# Patient Record
Sex: Female | Born: 1975
Health system: Southern US, Community
[De-identification: ages and names within clinical notes are randomized; demographics above are authoritative.]

## PROBLEM LIST (undated history)

## (undated) DIAGNOSIS — R7303 Prediabetes: Secondary | ICD-10-CM

## (undated) DIAGNOSIS — N760 Acute vaginitis: Secondary | ICD-10-CM

## (undated) DIAGNOSIS — E079 Disorder of thyroid, unspecified: Secondary | ICD-10-CM

## (undated) DIAGNOSIS — E039 Hypothyroidism, unspecified: Secondary | ICD-10-CM

## (undated) DIAGNOSIS — F419 Anxiety disorder, unspecified: Secondary | ICD-10-CM

## (undated) DIAGNOSIS — F32A Depression, unspecified: Secondary | ICD-10-CM

## (undated) DIAGNOSIS — B9689 Other specified bacterial agents as the cause of diseases classified elsewhere: Secondary | ICD-10-CM

## (undated) DIAGNOSIS — K760 Fatty (change of) liver, not elsewhere classified: Secondary | ICD-10-CM

## (undated) DIAGNOSIS — G4730 Sleep apnea, unspecified: Secondary | ICD-10-CM

## (undated) HISTORY — DX: Anxiety disorder, unspecified: F41.9

## (undated) HISTORY — DX: Disorder of thyroid, unspecified: E07.9

## (undated) HISTORY — DX: Sleep apnea, unspecified: G47.30

## (undated) HISTORY — PX: ABLATION: SHX5711

## (undated) HISTORY — PX: DILATION AND CURETTAGE OF UTERUS: SHX78

## (undated) HISTORY — PX: TUBAL LIGATION: SHX77

## (undated) HISTORY — DX: Prediabetes: R73.03

## (undated) HISTORY — DX: Fatty (change of) liver, not elsewhere classified: K76.0

---

## 1999-01-26 ENCOUNTER — Other Ambulatory Visit: Admission: RE | Admit: 1999-01-26 | Discharge: 1999-01-26 | Payer: Self-pay | Admitting: *Deleted

## 1999-07-20 ENCOUNTER — Inpatient Hospital Stay (HOSPITAL_COMMUNITY): Admission: AD | Admit: 1999-07-20 | Discharge: 1999-07-20 | Payer: Self-pay | Admitting: *Deleted

## 1999-07-21 ENCOUNTER — Inpatient Hospital Stay (HOSPITAL_COMMUNITY): Admission: AD | Admit: 1999-07-21 | Discharge: 1999-07-24 | Payer: Self-pay | Admitting: *Deleted

## 1999-07-24 ENCOUNTER — Encounter (HOSPITAL_COMMUNITY): Admission: RE | Admit: 1999-07-24 | Discharge: 1999-10-22 | Payer: Self-pay | Admitting: *Deleted

## 1999-09-02 ENCOUNTER — Other Ambulatory Visit: Admission: RE | Admit: 1999-09-02 | Discharge: 1999-09-02 | Payer: Self-pay | Admitting: *Deleted

## 2000-11-10 ENCOUNTER — Other Ambulatory Visit: Admission: RE | Admit: 2000-11-10 | Discharge: 2000-11-10 | Payer: Self-pay | Admitting: *Deleted

## 2001-06-12 ENCOUNTER — Inpatient Hospital Stay (HOSPITAL_COMMUNITY): Admission: AD | Admit: 2001-06-12 | Discharge: 2001-06-14 | Payer: Self-pay | Admitting: *Deleted

## 2001-07-24 ENCOUNTER — Other Ambulatory Visit: Admission: RE | Admit: 2001-07-24 | Discharge: 2001-07-24 | Payer: Self-pay | Admitting: *Deleted

## 2003-02-14 ENCOUNTER — Other Ambulatory Visit: Admission: RE | Admit: 2003-02-14 | Discharge: 2003-02-14 | Payer: Self-pay | Admitting: Family Medicine

## 2004-11-22 ENCOUNTER — Emergency Department: Payer: Self-pay | Admitting: Emergency Medicine

## 2005-06-17 ENCOUNTER — Emergency Department: Payer: Self-pay | Admitting: Emergency Medicine

## 2006-01-31 ENCOUNTER — Ambulatory Visit: Payer: Self-pay

## 2006-08-23 ENCOUNTER — Ambulatory Visit: Payer: Self-pay | Admitting: Obstetrics & Gynecology

## 2006-09-12 ENCOUNTER — Ambulatory Visit: Payer: Self-pay | Admitting: Obstetrics & Gynecology

## 2007-01-25 ENCOUNTER — Encounter: Payer: Self-pay | Admitting: Podiatry

## 2008-09-04 ENCOUNTER — Ambulatory Visit: Payer: Self-pay | Admitting: Unknown Physician Specialty

## 2008-09-11 ENCOUNTER — Ambulatory Visit: Payer: Self-pay | Admitting: Surgery

## 2008-09-16 ENCOUNTER — Ambulatory Visit: Payer: Self-pay | Admitting: Surgery

## 2008-10-15 ENCOUNTER — Ambulatory Visit: Payer: Self-pay | Admitting: Internal Medicine

## 2008-10-22 ENCOUNTER — Ambulatory Visit: Payer: Self-pay | Admitting: Internal Medicine

## 2008-10-24 ENCOUNTER — Ambulatory Visit: Payer: Self-pay | Admitting: Internal Medicine

## 2008-10-24 DIAGNOSIS — K760 Fatty (change of) liver, not elsewhere classified: Secondary | ICD-10-CM

## 2008-10-24 HISTORY — PX: CHOLECYSTECTOMY: SHX55

## 2008-10-24 HISTORY — DX: Fatty (change of) liver, not elsewhere classified: K76.0

## 2008-10-29 ENCOUNTER — Ambulatory Visit: Payer: Self-pay

## 2008-10-30 ENCOUNTER — Ambulatory Visit: Payer: Self-pay

## 2008-11-04 ENCOUNTER — Ambulatory Visit: Payer: Self-pay | Admitting: Unknown Physician Specialty

## 2008-11-04 ENCOUNTER — Ambulatory Visit: Payer: Self-pay | Admitting: Radiology

## 2008-11-24 ENCOUNTER — Ambulatory Visit: Payer: Self-pay | Admitting: Internal Medicine

## 2010-01-14 ENCOUNTER — Ambulatory Visit: Payer: Self-pay | Admitting: Obstetrics & Gynecology

## 2010-02-24 ENCOUNTER — Ambulatory Visit: Payer: Self-pay

## 2010-06-03 ENCOUNTER — Encounter: Payer: Self-pay | Admitting: Obstetrics and Gynecology

## 2010-06-17 ENCOUNTER — Encounter: Payer: Self-pay | Admitting: Maternal & Fetal Medicine

## 2010-06-21 ENCOUNTER — Encounter: Payer: Self-pay | Admitting: Maternal & Fetal Medicine

## 2010-06-25 ENCOUNTER — Ambulatory Visit: Payer: Self-pay

## 2010-07-15 ENCOUNTER — Ambulatory Visit: Payer: Self-pay

## 2010-10-13 ENCOUNTER — Inpatient Hospital Stay: Payer: Self-pay

## 2013-10-13 ENCOUNTER — Encounter (HOSPITAL_COMMUNITY): Payer: Self-pay | Admitting: Emergency Medicine

## 2013-10-13 ENCOUNTER — Emergency Department (HOSPITAL_COMMUNITY)
Admission: EM | Admit: 2013-10-13 | Discharge: 2013-10-13 | Disposition: A | Discharge status: Home / Self Care | Payer: 59 | Source: Home / Self Care | Attending: Family Medicine | Admitting: Family Medicine

## 2013-10-13 DIAGNOSIS — J069 Acute upper respiratory infection, unspecified: Secondary | ICD-10-CM

## 2013-10-13 DIAGNOSIS — J329 Chronic sinusitis, unspecified: Secondary | ICD-10-CM

## 2013-10-13 MED ORDER — METHYLPREDNISOLONE 4 MG PO KIT: PACK | ORAL | Status: DC

## 2013-10-13 MED ORDER — FLUTICASONE PROPIONATE 50 MCG/ACT NA SUSP: 2.0000 | Freq: Two times a day (BID) | NASAL | Status: DC

## 2013-10-13 MED ORDER — DOXYCYCLINE HYCLATE 100 MG PO CAPS: 100.0000 mg | ORAL_CAPSULE | Freq: Two times a day (BID) | ORAL | Status: DC

## 2013-10-13 NOTE — ED Notes (Signed)
37 yr old female is here with complaints of sinus press;HA; ST; ear soreness - Both; chest congestion; cough- yellowish since Friday. She states she has tried Mucinex, Benadryl and Ibuprofen with no success. Denies: SOB; Chest pain

## 2013-10-13 NOTE — ED Provider Notes (Signed)
CSN: [id number]     Arrival date & time 10/13/13  1703 History   First MD Initiated Contact with Patient 10/13/13 1825     Chief Complaint  Patient presents with  . URI   (Consider location/radiation/quality/duration/timing/severity/associated sxs/prior Treatment) HPI Comments: 37 year old female presents complaining of 3 days of sinus pressure, headache, sore throat, ear soreness, cough, chest congestion. She's been taking numerous over-the-counter medications without relief of her symptoms. She also has had a subjective fever at home. No recent travel or sick contacts.  Patient is a 37 y.o. female presenting with URI.  URI Presenting symptoms: congestion, cough, fever, rhinorrhea and sore throat   Associated symptoms: no arthralgias and no myalgias     History reviewed. No pertinent past medical history. History reviewed. No pertinent past surgical history. No family history on file. History  Substance Use Topics  . Smoking status: Never Smoker   . Smokeless tobacco: Not on file  . Alcohol Use: No   OB History   Grav Para Term Preterm Abortions TAB SAB Ect Mult Living                 Review of Systems  Constitutional: Positive for fever and chills.  HENT: Positive for congestion, postnasal drip, rhinorrhea, sinus pressure and sore throat.   Eyes: Negative for visual disturbance.  Respiratory: Positive for cough. Negative for shortness of breath.   Cardiovascular: Negative for chest pain, palpitations and leg swelling.  Gastrointestinal: Negative for nausea, vomiting and abdominal pain.  Endocrine: Negative for polydipsia and polyuria.  Genitourinary: Negative for dysuria, urgency and frequency.  Musculoskeletal: Negative for arthralgias and myalgias.  Skin: Negative for rash.  Neurological: Negative for dizziness, weakness and light-headedness.    Allergies  Review of patient's allergies indicates no known allergies.  Home Medications   Current Outpatient Rx  Name   Route  Sig  Dispense  Refill  . levothyroxine (SYNTHROID, LEVOTHROID) 75 MCG tablet   Oral   Take 75 mcg by mouth daily before breakfast.         . doxycycline (VIBRAMYCIN) 100 MG capsule   Oral   Take 1 capsule (100 mg total) by mouth 2 (two) times daily.   20 capsule   0   . fluticasone (FLONASE) 50 MCG/ACT nasal spray   Each Nare   Place 2 sprays into both nostrils 2 (two) times daily.   1 g   2   . methylPREDNISolone (MEDROL DOSEPAK) 4 MG tablet      follow package directions   21 tablet   0     Dispense as written.    BP 129/104  Pulse 122  Temp(Src) 100.3 F (37.9 C) (Oral)  Resp 22  SpO2 100%  LMP 09/27/2013 Physical Exam  Nursing note and vitals reviewed. Constitutional: She is oriented to person, place, and time. Vital signs are normal. She appears well-developed and well-nourished. No distress.  HENT:  Head: Normocephalic and atraumatic.  Right Ear: External ear normal.  Left Ear: External ear normal.  Nose: Right sinus exhibits maxillary sinus tenderness and frontal sinus tenderness. Left sinus exhibits maxillary sinus tenderness and frontal sinus tenderness.  Mouth/Throat: Posterior oropharyngeal erythema present. No oropharyngeal exudate.  Neck: Normal range of motion. Neck supple.  Cardiovascular: Normal rate, regular rhythm and normal heart sounds.  Exam reveals no gallop and no friction rub.   No murmur heard. Pulmonary/Chest: Effort normal and breath sounds normal. No respiratory distress. She has no wheezes. She has no rales.  Lymphadenopathy:    She has no cervical adenopathy.  Neurological: She is alert and oriented to person, place, and time. She has normal strength. Coordination normal.  Skin: Skin is warm and dry. No rash noted. She is not diaphoretic.  Psychiatric: She has a normal mood and affect. Judgment normal.    ED Course  Procedures (including critical care time) Labs Review Labs Reviewed - No data to display Imaging  Review No results found.    MDM   1. Sinusitis   2. URI (upper respiratory infection)    The patient still has a low-grade fever and tachycardia after 3 days with ibuprofen onboard. She is having significant discomfort, will treat with antibiotics in addition to symptomatic management. Followup when necessary  Discharge Medication List as of 10/13/2013  6:39 PM    START taking these medications   Details  doxycycline (VIBRAMYCIN) 100 MG capsule Take 1 capsule (100 mg total) by mouth 2 (two) times daily., Starting 10/13/2013, Until Discontinued, Print    fluticasone (FLONASE) 50 MCG/ACT nasal spray Place 2 sprays into both nostrils 2 (two) times daily., Starting 10/13/2013, Until Discontinued, Print    methylPREDNISolone (MEDROL DOSEPAK) 4 MG tablet follow package directions, Print           Graylon Good, PA-C 10/13/13 1843

## 2013-10-14 NOTE — ED Provider Notes (Signed)
Medical screening examination/treatment/procedure(s) were performed by a resident physician or non-physician practitioner and as the supervising physician I was immediately available for consultation/collaboration.  Clementeen Graham, MD    Rodolph Bong, MD 10/14/13 [phone]

## 2013-10-24 DIAGNOSIS — R7303 Prediabetes: Secondary | ICD-10-CM

## 2013-10-24 HISTORY — DX: Prediabetes: R73.03

## 2014-08-22 ENCOUNTER — Ambulatory Visit: Payer: Self-pay | Admitting: Internal Medicine

## 2014-08-24 ENCOUNTER — Ambulatory Visit: Payer: Self-pay | Admitting: Internal Medicine

## 2014-09-23 ENCOUNTER — Ambulatory Visit: Payer: Self-pay | Admitting: Internal Medicine

## 2014-10-24 DIAGNOSIS — G4730 Sleep apnea, unspecified: Secondary | ICD-10-CM

## 2014-10-24 HISTORY — DX: Sleep apnea, unspecified: G47.30

## 2015-02-18 ENCOUNTER — Ambulatory Visit: Payer: Self-pay

## 2015-03-16 ENCOUNTER — Other Ambulatory Visit: Payer: 59

## 2015-03-16 NOTE — Patient Outreach (Signed)
[facility]) Care Management  [place]  03/16/2015   Latoya Logan 1976/04/01 [id number]  Subjective: Patient in for her regular visit- very motivated with recent decrease in A1C to 5.5%. Routinely going to the gym.  Is not checking blood sugars.  Recently separated from her husband but denies depression.   Objective:  Filed Vitals:   03/16/15 1642  BP: 108/80  Weight 141.5lbs   Current Medications:  Current Outpatient Prescriptions  Medication Sig Dispense Refill  . Cetirizine HCl (ZYRTEC ALLERGY PO) Take 1 tablet by mouth.    . fexofenadine (ALLEGRA) 30 MG tablet Take 30 mg by mouth 2 (two) times daily.    . fluticasone (FLONASE) 50 MCG/ACT nasal spray Place 2 sprays into both nostrils 2 (two) times daily. 1 g 2  . levonorgestrel (MIRENA) 20 MCG/24HR IUD 1 each by Intrauterine route once.    . doxycycline (VIBRAMYCIN) 100 MG capsule Take 1 capsule (100 mg total) by mouth 2 (two) times daily. (Patient not taking: Reported on 03/16/2015) 20 capsule 0  . levothyroxine (SYNTHROID, LEVOTHROID) 75 MCG tablet Take 75 mcg by mouth daily before breakfast.    . methylPREDNISolone (MEDROL DOSEPAK) 4 MG tablet follow package directions (Patient not taking: Reported on 03/16/2015) 21 tablet 0   No current facility-administered medications for this visit.    Functional Status:  In your present state of health, do you have any difficulty performing the following activities: 03/16/2015  Hearing? N  Vision? N  Difficulty concentrating or making decisions? N  Walking or climbing stairs? N  Dressing or bathing? N  Doing errands, shopping? N    Fall/Depression Screening: PHQ 2/9 Scores 03/16/2015  PHQ - 2 Score 0    THN CM Care Plan Problem One        Patient Outreach from 03/16/2015 in [place] Problem One  Potential for elevated blood sugars   Care Plan for Problem One  Active   THN Long Term Goal (31-90 days)  Maintain A1C less  than 6.4%   [facility]N Long Term Goal Start Date  03/16/15   Interventions for Problem One Long Term Goal  -Continue exercising most days of the week for at least 30 minutes  - check sugar twice a week,  1 fasting and 1 2 hours after a meal    - if you want to lose the extra few pounds, you may want to try the Lose It app      [name], RN, [place], [place], [place]:  [phone]  Fax:  [phone] E-mail: [email or web address] [address] [place], [place]  [address]

## 2015-03-23 ENCOUNTER — Encounter: Payer: Self-pay | Admitting: Emergency Medicine

## 2015-03-23 ENCOUNTER — Emergency Department
Admission: EM | Admit: 2015-03-23 | Discharge: 2015-03-23 | Disposition: A | Discharge status: Home / Self Care | Payer: 59 | Attending: Emergency Medicine | Admitting: Emergency Medicine

## 2015-03-23 ENCOUNTER — Emergency Department: Payer: 59

## 2015-03-23 DIAGNOSIS — Y998 Other external cause status: Secondary | ICD-10-CM | POA: Insufficient documentation

## 2015-03-23 DIAGNOSIS — Z79899 Other long term (current) drug therapy: Secondary | ICD-10-CM | POA: Insufficient documentation

## 2015-03-23 DIAGNOSIS — S0993XA Unspecified injury of face, initial encounter: Secondary | ICD-10-CM | POA: Diagnosis present

## 2015-03-23 DIAGNOSIS — T148XXA Other injury of unspecified body region, initial encounter: Secondary | ICD-10-CM

## 2015-03-23 DIAGNOSIS — S0083XA Contusion of other part of head, initial encounter: Secondary | ICD-10-CM | POA: Insufficient documentation

## 2015-03-23 DIAGNOSIS — Y9389 Activity, other specified: Secondary | ICD-10-CM | POA: Insufficient documentation

## 2015-03-23 DIAGNOSIS — Y9289 Other specified places as the place of occurrence of the external cause: Secondary | ICD-10-CM | POA: Diagnosis not present
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
## 2015-03-23 NOTE — ED Notes (Signed)
Pt reports that she has safe place to go. Pt walked out of hospital and into parking lot. Pt in car safely.

## 2015-03-23 NOTE — ED Notes (Signed)
Pt alert and oriented X4, active, cooperative, pt in NAD. RR even and unlabored, color WNL.  Pt informed to return if any life threatening symptoms occur.   

## 2015-03-23 NOTE — ED Notes (Signed)
Pt back in room.
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
## 2015-03-23 NOTE — ED Notes (Signed)
[facility] at bedside.

## 2015-03-23 NOTE — Discharge Instructions (Signed)
Please seek medical attention for any high fevers, chest pain, shortness of breath, change in behavior, persistent vomiting, bloody stool or any other new or concerning symptoms.  Contusion A contusion is a deep bruise. Contusions are the result of an injury that caused bleeding under the skin. The contusion may turn blue, purple, or yellow. Minor injuries will give you a painless contusion, but more severe contusions may stay painful and swollen for a few weeks.  CAUSES  A contusion is usually caused by a blow, trauma, or direct force to an area of the body. SYMPTOMS   Swelling and redness of the injured area.  Bruising of the injured area.  Tenderness and soreness of the injured area.  Pain. DIAGNOSIS  The diagnosis can be made by taking a history and physical exam. An X-ray, CT scan, or MRI may be needed to determine if there were any associated injuries, such as fractures. TREATMENT  Specific treatment will depend on what area of the body was injured. In general, the best treatment for a contusion is resting, icing, elevating, and applying cold compresses to the injured area. Over-the-counter medicines may also be recommended for pain control. Ask your caregiver what the best treatment is for your contusion. HOME CARE INSTRUCTIONS   Put ice on the injured area.  Put ice in a plastic bag.  Place a towel between your skin and the bag.  Leave the ice on for 15-20 minutes, 3-4 times a day, or as directed by your health care provider.  Only take over-the-counter or prescription medicines for pain, discomfort, or fever as directed by your caregiver. Your caregiver may recommend avoiding anti-inflammatory medicines (aspirin, ibuprofen, and naproxen) for 48 hours because these medicines may increase bruising.  Rest the injured area.  If possible, elevate the injured area to reduce swelling. SEEK IMMEDIATE MEDICAL CARE IF:   You have increased bruising or swelling.  You have pain  that is getting worse.  Your swelling or pain is not relieved with medicines. MAKE SURE YOU:   Understand these instructions.  Will watch your condition.  Will get help right away if you are not doing well or get worse. Document Released: 07/20/2005 Document Revised: 10/15/2013 Document Reviewed: 08/15/2011 [facility] Patient Information 2015 [place], [place]. This information is not intended to replace advice given to you by your health care provider. Make sure you discuss any questions you have with your health care provider.
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
## 2015-03-23 NOTE — ED Notes (Signed)
Pt to CT

## 2015-04-30 ENCOUNTER — Emergency Department (HOSPITAL_COMMUNITY)
Admission: EM | Admit: 2015-04-30 | Discharge: 2015-04-30 | Disposition: A | Discharge status: Home / Self Care | Payer: 59 | Source: Home / Self Care | Attending: Family Medicine | Admitting: Family Medicine

## 2015-04-30 ENCOUNTER — Encounter (HOSPITAL_COMMUNITY): Payer: Self-pay | Admitting: Emergency Medicine

## 2015-04-30 ENCOUNTER — Other Ambulatory Visit (HOSPITAL_COMMUNITY)
Admission: RE | Admit: 2015-04-30 | Discharge: 2015-04-30 | Disposition: A | Discharge status: Home / Self Care | Payer: 59 | Source: Ambulatory Visit | Attending: Family Medicine | Admitting: Family Medicine

## 2015-04-30 DIAGNOSIS — Z711 Person with feared health complaint in whom no diagnosis is made: Secondary | ICD-10-CM

## 2015-04-30 DIAGNOSIS — N760 Acute vaginitis: Secondary | ICD-10-CM | POA: Insufficient documentation

## 2015-04-30 DIAGNOSIS — Z113 Encounter for screening for infections with a predominantly sexual mode of transmission: Secondary | ICD-10-CM | POA: Insufficient documentation

## 2015-04-30 LAB — POCT URINALYSIS DIP (DEVICE)
Bilirubin Urine: NEGATIVE
Glucose, UA: NEGATIVE mg/dL
Hgb urine dipstick: NEGATIVE
Ketones, ur: NEGATIVE mg/dL
Leukocytes, UA: NEGATIVE
Nitrite: NEGATIVE
PH: 7 (ref 5.0–8.0)
PROTEIN: NEGATIVE mg/dL
Specific Gravity, Urine: 1.015 (ref 1.005–1.030)
Urobilinogen, UA: 0.2 mg/dL (ref 0.0–1.0)

## 2015-04-30 LAB — POCT PREGNANCY, URINE: Preg Test, Ur: NEGATIVE

## 2015-04-30 MED ORDER — FLUTICASONE PROPIONATE 0.005 % EX OINT: 1.0000 "application " | TOPICAL_OINTMENT | Freq: Two times a day (BID) | CUTANEOUS | Status: DC

## 2015-04-30 NOTE — ED Provider Notes (Signed)
CSN: [id number]     Arrival date & time 04/30/15  1719 History   First MD Initiated Contact with Patient 04/30/15 1907     Chief Complaint  Patient presents with  . Vaginal Itching   (Consider location/radiation/quality/duration/timing/severity/associated sxs/prior Treatment) Patient is a 39 y.o. female presenting with vaginal itching. The history is provided by the patient.  Vaginal Itching This is a new problem. The current episode started more than 1 week ago (pubic area irritation from waxing procedure.). The problem has been gradually worsening. Pertinent negatives include no chest pain and no abdominal pain.    Past Medical History  Diagnosis Date  . Thyroid disease   . Pre-diabetes 2015  . Fatty liver 2010  . Sleep apnea 2016   Past Surgical History  Procedure Laterality Date  . Cholecystectomy  2010   Family History  Problem Relation Age of Onset  . Diabetes Mother   . Hypertension Mother   . Diabetes Father   . Heart disease Father   . Alcohol abuse Father    History  Substance Use Topics  . Smoking status: Never Smoker   . Smokeless tobacco: Never Used  . Alcohol Use: No   OB History    No data available     Review of Systems  Constitutional: Negative.   Cardiovascular: Negative for chest pain.  Gastrointestinal: Negative for abdominal pain.  Genitourinary: Negative for vaginal discharge, vaginal pain and pelvic pain.  Skin: Positive for rash.    Allergies  Percocet  Home Medications   Prior to Admission medications   Medication Sig Start Date End Date Taking? Authorizing Provider  Cetirizine HCl (ZYRTEC ALLERGY PO) Take 1 tablet by mouth.    Historical Provider, MD  doxycycline (VIBRAMYCIN) 100 MG capsule Take 1 capsule (100 mg total) by mouth 2 (two) times daily. Patient not taking: Reported on 03/16/2015 10/13/13   Liam Graham, PA-C  fexofenadine (ALLEGRA) 30 MG tablet Take 30 mg by mouth 2 (two) times daily.    Historical Provider, MD   fluticasone (CUTIVATE) 0.005 % ointment Apply 1 application topically 2 (two) times daily. 04/30/15   Billy Fischer, MD  fluticasone (FLONASE) 50 MCG/ACT nasal spray Place 2 sprays into both nostrils 2 (two) times daily. 10/13/13   Liam Graham, PA-C  levonorgestrel (MIRENA) 20 MCG/24HR IUD 1 each by Intrauterine route once.    Historical Provider, MD  levothyroxine (SYNTHROID, LEVOTHROID) 75 MCG tablet Take 75 mcg by mouth daily before breakfast.    Historical Provider, MD  methylPREDNISolone (MEDROL DOSEPAK) 4 MG tablet follow package directions Patient not taking: Reported on 03/16/2015 10/13/13   Liam Graham, PA-C   BP 131/69 mmHg  Pulse 66  Temp(Src) 98.1 F (36.7 C) (Oral)  Resp 16  SpO2 100%  LMP 04/23/2015 Physical Exam  Constitutional: She is oriented to person, place, and time. She appears well-developed and well-nourished.  Abdominal: Soft. Bowel sounds are normal. There is no tenderness.  Genitourinary: Vagina normal and uterus normal. There is no rash, tenderness or lesion on the right labia. There is no rash, tenderness or lesion on the left labia. Cervix exhibits no motion tenderness, no discharge and no friability. Right adnexum displays no tenderness and no fullness. Left adnexum displays no tenderness and no fullness. No erythema or tenderness in the vagina. No vaginal discharge found.  Neurological: She is alert and oriented to person, place, and time.  Skin: Skin is warm and dry.  Nursing note and vitals reviewed.  ED Course  Procedures (including critical care time) Labs Review Labs Reviewed  HIV ANTIBODY (ROUTINE TESTING)  RPR  POCT URINALYSIS DIP (DEVICE)  POCT PREGNANCY, URINE  CERVICOVAGINAL ANCILLARY ONLY    Imaging Review No results found.   MDM   1. Concern about STD in female without diagnosis       Billy Fischer, MD 04/30/15 [phone]

## 2015-04-30 NOTE — Discharge Instructions (Signed)
Use medicine as prescribed, we will call if tests show a need for other treatment.  

## 2015-04-30 NOTE — ED Notes (Signed)
C/o vaginal itching for a week now States she used jock itch spray as tx States area is irriatiated  Has used unprotective sex  Wants to be check for std

## 2015-05-01 LAB — CERVICOVAGINAL ANCILLARY ONLY
CHLAMYDIA, DNA PROBE: NEGATIVE
NEISSERIA GONORRHEA: NEGATIVE
WET PREP (BD AFFIRM): NEGATIVE

## 2015-05-01 LAB — HIV ANTIBODY (ROUTINE TESTING W REFLEX): HIV Screen 4th Generation wRfx: NONREACTIVE

## 2015-05-01 LAB — RPR: RPR Ser Ql: NONREACTIVE

## 2015-05-01 NOTE — ED Notes (Signed)
Final reports negative for GC, chlamydia,

## 2015-05-01 NOTE — ED Notes (Signed)
Final lab report negative for HIV, RPR, candida, gardnerella, trichomonas

## 2015-06-15 ENCOUNTER — Ambulatory Visit: Payer: 59

## 2015-06-15 ENCOUNTER — Other Ambulatory Visit: Payer: Self-pay

## 2015-06-15 NOTE — Patient Outreach (Signed)
[facility]) Care Management  06/15/2015  Latoya Logan [birth year] [id number]   Sent an e-mail requesting the patient contact me to reschedule today's missed appointment.    [name], RN, BA, [place], [place] Direct Dial:  [phone]  Fax:  [phone] E-mail: [email or web address] [address], [place], [place]  [address]

## 2015-06-15 NOTE — Patient Outreach (Signed)
[facility]) Care Management  06/15/2015  Latoya Logan 1976-02-17 [id number]   Patient did not show for scheduled appointment.   [name], RN, BA, [place], [place] Direct Dial:  [phone]  Fax:  [phone] E-mail: [email or web address] [address], [place], [place]  [address]

## 2015-09-29 ENCOUNTER — Other Ambulatory Visit (HOSPITAL_COMMUNITY)
Admission: RE | Admit: 2015-09-29 | Discharge: 2015-09-29 | Disposition: A | Discharge status: Home / Self Care | Payer: 59 | Source: Ambulatory Visit | Attending: Family Medicine | Admitting: Family Medicine

## 2015-09-29 ENCOUNTER — Emergency Department (HOSPITAL_COMMUNITY)
Admission: EM | Admit: 2015-09-29 | Discharge: 2015-09-29 | Disposition: A | Discharge status: Home / Self Care | Payer: 59 | Source: Home / Self Care

## 2015-09-29 ENCOUNTER — Encounter (HOSPITAL_COMMUNITY): Payer: Self-pay | Admitting: Emergency Medicine

## 2015-09-29 DIAGNOSIS — Z113 Encounter for screening for infections with a predominantly sexual mode of transmission: Secondary | ICD-10-CM | POA: Diagnosis not present

## 2015-09-29 DIAGNOSIS — N939 Abnormal uterine and vaginal bleeding, unspecified: Secondary | ICD-10-CM

## 2015-09-29 DIAGNOSIS — N760 Acute vaginitis: Secondary | ICD-10-CM | POA: Diagnosis present

## 2015-09-29 LAB — POCT PREGNANCY, URINE: Preg Test, Ur: NEGATIVE

## 2015-09-29 NOTE — ED Notes (Signed)
The patient presented to the [facility] with a complaint of abnormal vaginal bleeding that has been going on a week to 8 days.

## 2015-09-29 NOTE — Discharge Instructions (Signed)
Abnormal Uterine Bleeding °Abnormal uterine bleeding means bleeding from the vagina that is not your normal menstrual period. This can be: °· Bleeding or spotting between periods. °· Bleeding after sex (sexual intercourse). °· Bleeding that is heavier or more than normal. °· Periods that last longer than usual. °· Bleeding after menopause. °There are many problems that may cause this. Treatment will depend on the cause of the bleeding. Any kind of bleeding that is not normal should be reviewed by your doctor.  °HOME CARE °Watch your condition for any changes. These actions may lessen any discomfort you are having: °· Do not use tampons or douches as told by your doctor. °· Change your pads often. °You should get regular pelvic exams and Pap tests. Keep all appointments for tests as told by your doctor. °GET HELP IF: °· You are bleeding for more than 1 week. °· You feel dizzy at times. °GET HELP RIGHT AWAY IF:  °· You pass out. °· You have to change pads every 15 to 30 minutes. °· You have belly pain. °· You have a fever. °· You become sweaty or weak. °· You are passing large blood clots from the vagina. °· You feel sick to your stomach (nauseous) and throw up (vomit). °MAKE SURE YOU: °· Understand these instructions. °· Will watch your condition. °· Will get help right away if you are not doing well or get worse. °  °This information is not intended to replace advice given to you by your health care provider. Make sure you discuss any questions you have with your health care provider. °  °Document Released: 08/07/2009 Document Revised: 10/15/2013 Document Reviewed: 05/09/2013 °Elsevier Interactive Patient Education ©2016 [employer] Inc[employer] ° °

## 2015-09-29 NOTE — ED Provider Notes (Signed)
CSN: [id number]     Arrival date & time 09/29/15  1757 History   None    Chief Complaint  Patient presents with  . Vaginal Bleeding   (Consider location/radiation/quality/duration/timing/severity/associated sxs/prior Treatment) HPI History obtained from patient:   LOCATION: Vagina SEVERITY: No pain DURATION: 2 weeks CONTEXT: Continued spotting after menses, patient states that she is sexually active but has one partner at this time. She has no concerns that she has an STD at this time. She is a bit concerned about the possibility of being pregnant, her form of birth control is the IUD which she was told was in good position on the last vaginal exam.   MODIFYING FACTORS: None ASSOCIATED SYMPTOMS: None TIMING: Episodic OCCUPATION: Development worker, community  Past Medical History  Diagnosis Date  . Thyroid disease   . Pre-diabetes 2015  . Fatty liver 2010  . Sleep apnea 2016   Past Surgical History  Procedure Laterality Date  . Cholecystectomy  2010   Family History  Problem Relation Age of Onset  . Diabetes Mother   . Hypertension Mother   . Diabetes Father   . Heart disease Father   . Alcohol abuse Father    Social History  Substance Use Topics  . Smoking status: Never Smoker   . Smokeless tobacco: Never Used  . Alcohol Use: No   OB History    No data available     Review of Systems  Constitutional: Negative.   HENT: Negative.   Respiratory: Negative.   Cardiovascular: Negative.   Genitourinary: Positive for vaginal bleeding.    Allergies  Percocet  Home Medications   Prior to Admission medications   Medication Sig Start Date End Date Taking? Authorizing Provider  Cetirizine HCl (ZYRTEC ALLERGY PO) Take 1 tablet by mouth.    Historical Provider, MD  doxycycline (VIBRAMYCIN) 100 MG capsule Take 1 capsule (100 mg total) by mouth 2 (two) times daily. Patient not taking: Reported on 03/16/2015 10/13/13   Liam Graham, PA-C  fexofenadine (ALLEGRA) 30 MG tablet  Take 30 mg by mouth 2 (two) times daily.    Historical Provider, MD  fluticasone (CUTIVATE) 0.005 % ointment Apply 1 application topically 2 (two) times daily. 04/30/15   Billy Fischer, MD  fluticasone (FLONASE) 50 MCG/ACT nasal spray Place 2 sprays into both nostrils 2 (two) times daily. 10/13/13   Liam Graham, PA-C  levonorgestrel (MIRENA) 20 MCG/24HR IUD 1 each by Intrauterine route once.    Historical Provider, MD  levothyroxine (SYNTHROID, LEVOTHROID) 75 MCG tablet Take 75 mcg by mouth daily before breakfast.    Historical Provider, MD  methylPREDNISolone (MEDROL DOSEPAK) 4 MG tablet follow package directions Patient not taking: Reported on 03/16/2015 10/13/13   Liam Graham, PA-C   Meds Ordered and Administered this Visit  Medications - No data to display  BP 125/76 mmHg  Pulse 64  Temp(Src) 98.6 F (37 C) (Oral)  Resp 16  SpO2 100%  LMP 09/17/2015 (Exact Date) No data found.   Physical Exam  Constitutional: She appears well-developed and well-nourished.  HENT:  Head: Normocephalic and atraumatic.  Pulmonary/Chest: Effort normal.  Genitourinary:    Vaginal examination was done with patient's permission and the female chaperone present.  Nursing note and vitals reviewed.   ED Course  Procedures (including critical care time)  Labs Review Labs Reviewed  POCT PREGNANCY, URINE  CERVICOVAGINAL ANCILLARY ONLY    Imaging Review No results found.   Visual Acuity Review  Right Eye Distance:  Left Eye Distance:   Bilateral Distance:    Right Eye Near:   Left Eye Near:    Bilateral Near:         MDM   1. Abnormal uterine bleeding (AUB)    as discussed with patient prior to examination I advised her that I may not have an exact answer for her abnormal uterine bleeding tonight can reassure her that there is no life-threatening hemorrhage. I have also advised her that she has an appointment with her gynecologist next week she should keep that appointment  and have her abnormal uterine bleeding evaluated by the specialist at that time. She is advised that if bleeding worsens to the point that she is soaking pads she is attending the emergency department. Instructions of care provided discharged home in stable condition.    Konrad Felix, PA 09/29/15 2004

## 2015-09-30 LAB — CERVICOVAGINAL ANCILLARY ONLY
Chlamydia: NEGATIVE
Neisseria Gonorrhea: NEGATIVE

## 2015-10-01 LAB — CERVICOVAGINAL ANCILLARY ONLY: Wet Prep (BD Affirm): NEGATIVE

## 2015-10-01 NOTE — ED Notes (Addendum)
Final report of STD testing ane wet prep tests are negative

## 2015-10-28 ENCOUNTER — Other Ambulatory Visit: Payer: Self-pay

## 2015-10-28 NOTE — Patient Outreach (Signed)
[facility]) Care Management  10/28/2015  Skyanna Blehm March 27, 1976 [id number]  I sent a meeting request on 10/20/15 @ 1:09pm to Maevyn to attend a meeting with me this morning, after I ran into  Lorain.  (She told me she was going to be in [place] the first week of January and to schedule an appointment.)  I had set a meeting for this morning and I sent her an e-mail but I have not heard from her.  I sent an additional me-mail message this morning.  I will d/c her from the program if she does not schedule by January 9th.   [name], RN, BA, [place], [place] Direct Dial:  [phone]  Fax:  [phone] E-mail: [email or web address] [address] [place], [place]  [address]

## 2015-11-03 ENCOUNTER — Other Ambulatory Visit: Payer: Self-pay

## 2015-11-03 NOTE — Patient Outreach (Signed)
[place] [facility]) Care Management  11/03/2015  Latoya Logan 1975-11-08 [id number]   Spoke to [name]- I have scheduled our next appointment for 11/09/15.   [name], RN, BA, [place],  Direct Dial:  [phone]  Fax:  [phone] E-mail: [email or web address] [address], [place], [place]  [address]

## 2015-11-03 NOTE — Patient Outreach (Signed)
[facility]) Care Management  11/03/2015  Latoya Logan 1976-01-11 [id number]  Patient left a message for me to call to schedule with her.   [name], RN, BA, [place], [place] Direct Dial:  [phone]  Fax:  [phone] E-mail: [email or web address] [address] [place], [place]  [address]

## 2015-11-09 ENCOUNTER — Other Ambulatory Visit: Payer: Self-pay

## 2015-11-09 NOTE — Patient Outreach (Signed)
[facility]) Care Management  11/09/2015  Amandine Auger 26-Dec-1975 [id number]  Called patient to complete Link to Wellness visit- patient did not pick up.  Left message to call me.   [name], RN, BA, [place], [place] Direct Dial:  [phone]  Fax:  [phone] E-mail: [email or web address] [address], [place], [place]  [address]

## 2015-11-09 NOTE — Patient Outreach (Signed)
[facility]) Care Management  11/09/2015  Latoya Logan 1976-01-27 [id number]   Patient returned my call.  She had forgotten about today's visit so we rescheduled the appointment to 11/12/15.   [name], RN, BA, [place], [place] Direct Dial:  [phone]  Fax:  [phone] E-mail: [email or web address] [address] [place], [place]  [address]

## 2015-12-09 ENCOUNTER — Other Ambulatory Visit: Payer: Self-pay

## 2015-12-09 NOTE — Patient Outreach (Signed)
[facility]) Care Management  12/09/2015  Rodina Sanmartin 1976-01-28 [id number]  I have sent an e-mail to [facility] requesting she e-mail me with some available dates to visit with me.   [name], RN, BA, [place], [place] Direct Dial:  [phone]  Fax:  [phone] E-mail: [email or web address] [address] [place], [place]  [address]

## 2015-12-17 MED FILL — FLUTICASONE PROP 50 MCG SPR: 50 | 30 days supply | Qty: 16 | Fill #0

## 2015-12-21 ENCOUNTER — Other Ambulatory Visit: Payer: Self-pay

## 2015-12-21 VITALS — BP 120/70 | Ht 59.5 in | Wt 139.2 lb

## 2015-12-21 DIAGNOSIS — R7309 Other abnormal glucose: Secondary | ICD-10-CM

## 2015-12-21 NOTE — Patient Outreach (Signed)
[facility]) Care Management  [place]  12/21/2015   Latoya Logan 1976/08/18 [id number]  Subjective: Patient in for a Link to Wellness visit.  She reports labs in October, A1C 5.5%.  She is not checking blood sugars and is only exercising 1x/week because she has taken on a second job and works from [employer] after working full time at [employer] during the day. She reports no hypo or hyperglycemia symptoms.  She still watches what she eats and appears to limit carbohydrates. She drinks only diet products.  Objective:  Filed Vitals:   12/21/15 0941  BP: 120/70  Height: 1.511 m (4' 11.5")  Weight: 139 lb 3.2 oz (63.141 kg)     Current Medications:  Current Outpatient Prescriptions  Medication Sig Dispense Refill  . Cetirizine HCl (ZYRTEC ALLERGY PO) Take 1 tablet by mouth.    . fluticasone (FLONASE) 50 MCG/ACT nasal spray Place 2 sprays into both nostrils 2 (two) times daily. 1 g 2  . levonorgestrel (MIRENA) 20 MCG/24HR IUD 1 each by Intrauterine route once.    . loratadine (CLARITIN) 10 MG tablet Take 10 mg by mouth daily.    Marland Kitchen doxycycline (VIBRAMYCIN) 100 MG capsule Take 1 capsule (100 mg total) by mouth 2 (two) times daily. (Patient not taking: Reported on 03/16/2015) 20 capsule 0  . fexofenadine (ALLEGRA) 30 MG tablet Take 30 mg by mouth 2 (two) times daily. Reported on 12/21/2015    . fluticasone (CUTIVATE) 0.005 % ointment Apply 1 application topically 2 (two) times daily. (Patient not taking: Reported on 12/21/2015) 30 g 0  . levothyroxine (SYNTHROID, LEVOTHROID) 75 MCG tablet Take 75 mcg by mouth daily before breakfast. Reported on 12/21/2015    . methylPREDNISolone (MEDROL DOSEPAK) 4 MG tablet follow package directions (Patient not taking: Reported on 03/16/2015) 21 tablet 0   No current facility-administered medications for this visit.    Functional Status:  In your present state of health, do you have any difficulty performing the following  activities: 03/16/2015  Hearing? N  Vision? N  Difficulty concentrating or making decisions? N  Walking or climbing stairs? N  Dressing or bathing? N  Doing errands, shopping? N    Fall/Depression Screening: PHQ 2/9 Scores 12/21/2015 03/16/2015  PHQ - 2 Score 0 0    Assessment: We discussed the importance of checking blood sugars but although we set this as a goal for her on her last visit, she is not willing to set this as a goal today.  She cannot physically exercise more during the week.  She could fit in exercise during the weekend.    Plan:  [facility] CM Care Plan Problem One        Most Recent Value   Care Plan Problem One  Potential for elevated blood sugars   Role Documenting the Problem One  Care Management Coordinator   Care Plan for Problem One  Active   [facility] Long Term Goal (31-90 days)  Maintain A1C less than 6.4%   [facility] Long Term Goal Start Date  12/21/15   [facility] Long Term Goal Met Date  -- [A1C 5.5%]   Interventions for Problem One Long Term Goal  1. sign up for the rugged maniac race  2. commit to joining a running group after April 18th when the 2nd job is complete  3. Call Latoya Logan Assist for educational opportunities     I will follow up with Latoya Logan in 6 months.  Despite not checking blood sugars, she has  maintained her A1C within a healthy range.  She established the above goals so she can remain motivated and surrounding by people who have goals.   [name], RN, BA, [place], [place] Direct Dial:  [phone]  Fax:  [phone] E-mail: [email or web address] [address], [place], [place]  [address]

## 2016-02-17 DIAGNOSIS — L300 Nummular dermatitis: Secondary | ICD-10-CM | POA: Diagnosis not present

## 2016-02-17 DIAGNOSIS — D485 Neoplasm of uncertain behavior of skin: Secondary | ICD-10-CM | POA: Diagnosis not present

## 2016-02-17 DIAGNOSIS — L578 Other skin changes due to chronic exposure to nonionizing radiation: Secondary | ICD-10-CM | POA: Diagnosis not present

## 2016-02-17 DIAGNOSIS — D1800 Hemangioma unspecified site: Secondary | ICD-10-CM | POA: Diagnosis not present

## 2016-02-17 DIAGNOSIS — L814 Other melanin hyperpigmentation: Secondary | ICD-10-CM | POA: Diagnosis not present

## 2016-02-17 DIAGNOSIS — D229 Melanocytic nevi, unspecified: Secondary | ICD-10-CM | POA: Diagnosis not present

## 2016-02-17 DIAGNOSIS — Z1283 Encounter for screening for malignant neoplasm of skin: Secondary | ICD-10-CM | POA: Diagnosis not present

## 2016-02-17 MED FILL — TRIAMCINOLONE 0.1% CREAM: 0.1 | 15 days supply | Qty: 30 | Fill #0

## 2016-02-19 DIAGNOSIS — E784 Other hyperlipidemia: Secondary | ICD-10-CM | POA: Diagnosis not present

## 2016-02-19 DIAGNOSIS — Z114 Encounter for screening for human immunodeficiency virus [HIV]: Secondary | ICD-10-CM | POA: Diagnosis not present

## 2016-02-19 DIAGNOSIS — E034 Atrophy of thyroid (acquired): Secondary | ICD-10-CM | POA: Diagnosis not present

## 2016-02-19 DIAGNOSIS — R739 Hyperglycemia, unspecified: Secondary | ICD-10-CM | POA: Diagnosis not present

## 2016-02-22 DIAGNOSIS — Z0001 Encounter for general adult medical examination with abnormal findings: Secondary | ICD-10-CM | POA: Diagnosis not present

## 2016-02-22 DIAGNOSIS — D6489 Other specified anemias: Secondary | ICD-10-CM | POA: Diagnosis not present

## 2016-02-22 DIAGNOSIS — E034 Atrophy of thyroid (acquired): Secondary | ICD-10-CM | POA: Diagnosis not present

## 2016-02-22 DIAGNOSIS — R739 Hyperglycemia, unspecified: Secondary | ICD-10-CM | POA: Diagnosis not present

## 2016-02-22 MED FILL — LEVOTHYROXINE 50 MCG TABLET: 50 | 30 days supply | Qty: 30 | Fill #0

## 2016-02-25 DIAGNOSIS — E039 Hypothyroidism, unspecified: Secondary | ICD-10-CM | POA: Diagnosis not present

## 2016-02-25 DIAGNOSIS — L723 Sebaceous cyst: Secondary | ICD-10-CM | POA: Diagnosis not present

## 2016-02-25 DIAGNOSIS — N898 Other specified noninflammatory disorders of vagina: Secondary | ICD-10-CM | POA: Diagnosis not present

## 2016-02-29 MED FILL — metroNIDAZOLE 0.75 % GEL: 0.75 | 5 days supply | Qty: 70 | Fill #0

## 2016-03-22 MED FILL — TINIDAZOLE 500 MG TABLET: 500 | 2 days supply | Qty: 8 | Fill #0

## 2016-03-24 MED FILL — LEVOTHYROXINE 50 MCG TABLET: 50 | 30 days supply | Qty: 30 | Fill #1

## 2016-04-15 DIAGNOSIS — E034 Atrophy of thyroid (acquired): Secondary | ICD-10-CM | POA: Diagnosis not present

## 2016-04-25 MED FILL — LEVOTHYROXINE 50 MCG TABLET: 50 | 30 days supply | Qty: 30 | Fill #2

## 2016-05-23 DIAGNOSIS — M545 Low back pain: Secondary | ICD-10-CM | POA: Diagnosis not present

## 2016-05-23 DIAGNOSIS — H9201 Otalgia, right ear: Secondary | ICD-10-CM | POA: Diagnosis not present

## 2016-05-23 DIAGNOSIS — M5489 Other dorsalgia: Secondary | ICD-10-CM | POA: Diagnosis not present

## 2016-05-23 DIAGNOSIS — M47816 Spondylosis without myelopathy or radiculopathy, lumbar region: Secondary | ICD-10-CM | POA: Diagnosis not present

## 2016-05-25 ENCOUNTER — Emergency Department (HOSPITAL_COMMUNITY)
Admission: EM | Admit: 2016-05-25 | Discharge: 2016-05-26 | Disposition: A | Discharge status: Home / Self Care | Payer: 59 | Attending: Emergency Medicine | Admitting: Emergency Medicine

## 2016-05-25 ENCOUNTER — Encounter (HOSPITAL_COMMUNITY): Payer: Self-pay | Admitting: Emergency Medicine

## 2016-05-25 ENCOUNTER — Emergency Department (HOSPITAL_COMMUNITY): Payer: 59

## 2016-05-25 DIAGNOSIS — R102 Pelvic and perineal pain: Secondary | ICD-10-CM

## 2016-05-25 DIAGNOSIS — R1032 Left lower quadrant pain: Secondary | ICD-10-CM | POA: Diagnosis not present

## 2016-05-25 DIAGNOSIS — N72 Inflammatory disease of cervix uteri: Secondary | ICD-10-CM | POA: Insufficient documentation

## 2016-05-25 DIAGNOSIS — N898 Other specified noninflammatory disorders of vagina: Secondary | ICD-10-CM | POA: Diagnosis present

## 2016-05-25 HISTORY — DX: Acute vaginitis: N76.0

## 2016-05-25 HISTORY — DX: Other specified bacterial agents as the cause of diseases classified elsewhere: B96.89

## 2016-05-25 LAB — POC URINE PREG, ED: PREG TEST UR: NEGATIVE

## 2016-05-25 LAB — COMPREHENSIVE METABOLIC PANEL
ALT: 12 U/L — ABNORMAL LOW (ref 14–54)
ANION GAP: 5 (ref 5–15)
AST: 20 U/L (ref 15–41)
Albumin: 4.1 g/dL (ref 3.5–5.0)
Alkaline Phosphatase: 55 U/L (ref 38–126)
BUN: 12 mg/dL (ref 6–20)
CHLORIDE: 107 mmol/L (ref 101–111)
CO2: 25 mmol/L (ref 22–32)
Calcium: 9.3 mg/dL (ref 8.9–10.3)
Creatinine, Ser: 0.81 mg/dL (ref 0.44–1.00)
GFR calc Af Amer: >60 mL/min (ref >60)
GFR calc non Af Amer: >60 mL/min (ref >60)
Glucose, Bld: 95 mg/dL (ref 65–99)
Potassium: 4.3 mmol/L (ref 3.5–5.1)
SODIUM: 137 mmol/L (ref 135–145)
Total Bilirubin: 0.8 mg/dL (ref 0.3–1.2)
Total Protein: 7.4 g/dL (ref 6.5–8.1)

## 2016-05-25 LAB — URINALYSIS, ROUTINE W REFLEX MICROSCOPIC
BILIRUBIN URINE: NEGATIVE
Glucose, UA: NEGATIVE mg/dL
Ketones, ur: NEGATIVE mg/dL
NITRITE: NEGATIVE
Protein, ur: 30 mg/dL — AB
SPECIFIC GRAVITY, URINE: 1.024 (ref 1.005–1.030)
pH: 7 (ref 5.0–8.0)

## 2016-05-25 LAB — CBC
HCT: 40.5 % (ref 36.0–46.0)
HEMOGLOBIN: 13.4 g/dL (ref 12.0–15.0)
MCH: 31.5 pg (ref 26.0–34.0)
MCHC: 33.1 g/dL (ref 30.0–36.0)
MCV: 95.1 fL (ref 78.0–100.0)
Platelets: 231 10*3/uL (ref 150–400)
RBC: 4.26 MIL/uL (ref 3.87–5.11)
RDW: 12.3 % (ref 11.5–15.5)
WBC: 10.2 10*3/uL (ref 4.0–10.5)

## 2016-05-25 LAB — WET PREP, GENITAL
CLUE CELLS WET PREP: NONE SEEN
SPERM: NONE SEEN
Trich, Wet Prep: NONE SEEN
Yeast Wet Prep HPF POC: NONE SEEN

## 2016-05-25 LAB — URINE MICROSCOPIC-ADD ON

## 2016-05-25 MED ORDER — KETOROLAC TROMETHAMINE 60 MG/2ML IM SOLN
60.0000 mg | Freq: Once | INTRAMUSCULAR | Status: AC
  Administered 2016-05-26: 60 mg via INTRAMUSCULAR
  Filled 2016-05-25: qty 2

## 2016-05-25 MED ORDER — KETOROLAC TROMETHAMINE 30 MG/ML IJ SOLN: 30.0000 mg | Freq: Once | INTRAMUSCULAR | Status: DC

## 2016-05-25 MED ORDER — ONDANSETRON HCL 4 MG/2ML IJ SOLN: 4.0000 mg | Freq: Once | INTRAMUSCULAR | Status: DC

## 2016-05-25 MED ORDER — NAPROXEN 500 MG PO TABS: 500.0000 mg | ORAL_TABLET | Freq: Two times a day (BID) | ORAL | 0 refills | Status: DC

## 2016-05-25 MED ORDER — DOXYCYCLINE HYCLATE 100 MG PO CAPS: 100.0000 mg | ORAL_CAPSULE | Freq: Two times a day (BID) | ORAL | 0 refills | Status: DC

## 2016-05-25 MED ORDER — AZITHROMYCIN 250 MG PO TABS
1000.0000 mg | ORAL_TABLET | Freq: Once | ORAL | Status: AC
  Administered 2016-05-26: 1000 mg via ORAL
  Filled 2016-05-25: qty 4

## 2016-05-25 MED ORDER — MORPHINE SULFATE (PF) 4 MG/ML IV SOLN: 4.0000 mg | Freq: Once | INTRAVENOUS | Status: DC

## 2016-05-25 MED ORDER — CEFTRIAXONE SODIUM 250 MG IJ SOLR
250.0000 mg | Freq: Once | INTRAMUSCULAR | Status: AC
  Administered 2016-05-26: 250 mg via INTRAMUSCULAR
  Filled 2016-05-25: qty 250

## 2016-05-25 NOTE — ED Provider Notes (Signed)
[place] DEPT Provider Note   CSN: [id number] Arrival date & time: 05/25/16  1901  First Provider Contact:  None       History   Chief Complaint Chief Complaint  Patient presents with  . Vaginal Discharge  . Abdominal Pain    HPI Latoya Logan is a 40 y.o. female.  HPI   40 year old female with history of PCO S presenting for evaluation of pelvic pain. Patient does report for the past 10 days she has noticed increased vaginal odor but today she developed acute onset of left pelvic pain. She described her pain as a sharp achy sensation, nonradiating, 8 out of 10. She endorse nausea without vomiting. She also report persistent vaginal discharge. She denies having fever, chills, lightheadedness, dizziness, vomiting, diarrhea, constipation, dysuria, urinary frequency, burning urination. Does have a history of metromenorrhagia and had an extensive menstrual period that lasted for 2 weeks and ended 5 days ago. She is sexually active and has had unprotected sex. She denies any acute pain with sexual activity. She does have an OB/GYN but he is on vacation and she cannot follow-up. Has history of fatty liver liver disease and unable to take Tylenol. No specific treatment for her symptom currently. No prior history of STD. Report that she has been stressed out and have been busy with her work.    Past Medical History:  Diagnosis Date  . Bacterial vaginosis   . Fatty liver 2010  . Pre-diabetes 2015  . Sleep apnea 2016  . Thyroid disease     There are no active problems to display for this patient.   Past Surgical History:  Procedure Laterality Date  . CHOLECYSTECTOMY  2010    OB History    No data available       Home Medications    Prior to Admission medications   Medication Sig Start Date End Date Taking? Authorizing Provider  Cetirizine HCl (ZYRTEC ALLERGY PO) Take 1 tablet by mouth.    Historical Provider, MD  doxycycline (VIBRAMYCIN) 100 MG capsule Take 1  capsule (100 mg total) by mouth 2 (two) times daily. Patient not taking: Reported on 03/16/2015 10/13/13   Liam Graham, PA-C  fexofenadine (ALLEGRA) 30 MG tablet Take 30 mg by mouth 2 (two) times daily. Reported on 12/21/2015    Historical Provider, MD  fluticasone (CUTIVATE) 0.005 % ointment Apply 1 application topically 2 (two) times daily. Patient not taking: Reported on 12/21/2015 04/30/15   Billy Fischer, MD  fluticasone [facility]) 50 MCG/ACT nasal spray Place 2 sprays into both nostrils 2 (two) times daily. 10/13/13   Liam Graham, PA-C  levonorgestrel (MIRENA) 20 MCG/24HR IUD 1 each by Intrauterine route once.    Historical Provider, MD  levothyroxine (SYNTHROID, LEVOTHROID) 75 MCG tablet Take 75 mcg by mouth daily before breakfast. Reported on 12/21/2015    Historical Provider, MD  loratadine (CLARITIN) 10 MG tablet Take 10 mg by mouth daily.    Historical Provider, MD  methylPREDNISolone (MEDROL DOSEPAK) 4 MG tablet follow package directions Patient not taking: Reported on 03/16/2015 10/13/13   Liam Graham, PA-C    Family History Family History  Problem Relation Age of Onset  . Diabetes Mother   . Hypertension Mother   . Diabetes Father   . Heart disease Father   . Alcohol abuse Father     Social History Social History  Substance Use Topics  . Smoking status: Never Smoker  . Smokeless tobacco: Never Used  . Alcohol use  No     Allergies   Percocet [oxycodone-acetaminophen]   Review of Systems Review of Systems  All other systems reviewed and are negative.    Physical Exam Updated Vital Signs BP 131/83   Pulse 67   Temp 98.1 F (36.7 C)   Resp 16   Ht 4\' 11"  (1.499 m)   Wt 67.2 kg   LMP 05/06/2016   SpO2 100%   BMI 29.94 kg/m   Physical Exam  Constitutional: She appears well-developed and well-nourished. No distress.  HENT:  Head: Atraumatic.  Eyes: Conjunctivae are normal.  Neck: Neck supple.  Cardiovascular: Normal rate and regular rhythm.     Pulmonary/Chest: Effort normal and breath sounds normal.  Abdominal: Soft. Bowel sounds are normal. There is no tenderness. There is no guarding.  Genitourinary:  Genitourinary Comments: Pelvic exam: RN in room as chaperone, external female genitalia normal with no signs of lesions or injuries. Speculum exam shows normal cervix with discharge and trace of blood. Bimanual exam with L adnexal tenderness, no cervical motion tenderness, uterus normal size and nontender, no masses appreciated. The external cervical os is closed.   Neurological: She is alert.  Skin: No rash noted.  Psychiatric: She has a normal mood and affect.  Nursing note and vitals reviewed.    ED Treatments / Results  Labs (all labs ordered are listed, but only abnormal results are displayed) Labs Reviewed  WET PREP, GENITAL - Abnormal; Notable for the following:       Result Value   WBC, Wet Prep HPF POC MANY (*)    All other components within normal limits  COMPREHENSIVE METABOLIC PANEL - Abnormal; Notable for the following:    ALT 12 (*)    All other components within normal limits  URINALYSIS, ROUTINE W REFLEX MICROSCOPIC (NOT AT [facility]) - Abnormal; Notable for the following:    APPearance CLOUDY (*)    Hgb urine dipstick SMALL (*)    Protein, ur 30 (*)    Leukocytes, UA MODERATE (*)    All other components within normal limits  URINE MICROSCOPIC-ADD ON - Abnormal; Notable for the following:    Squamous Epithelial / LPF 0-5 (*)    Bacteria, UA MANY (*)    All other components within normal limits  CBC  RAPID HIV SCREEN (HIV 1/2 AB+AG)  RPR  POC URINE PREG, ED  GC/CHLAMYDIA PROBE AMP () NOT AT [facility]    EKG  EKG Interpretation None       Radiology US Transvaginal Non-ob  Result Date: 05/25/2016 CLINICAL DATA:  40 year old female with intermittent left lower quadrant pain and nausea for several days. Initial encounter. LMP 05/06/2016. EXAM: TRANSABDOMINAL AND TRANSVAGINAL ULTRASOUND OF  PELVIS DOPPLER ULTRASOUND OF OVARIES TECHNIQUE: Both transabdominal and transvaginal ultrasound examinations of the pelvis were performed. Transabdominal technique was performed for global imaging of the pelvis including uterus, ovaries, adnexal regions, and pelvic cul-de-sac. It was necessary to proceed with endovaginal exam following the transabdominal exam to visualize the ovaries. Color and duplex Doppler ultrasound was utilized to evaluate blood flow to the ovaries. COMPARISON:  CT Abdomen and Pelvis 11/04/2008. Report of [facility] lumbar radiographs 05/23/2016 (no images available). FINDINGS: Uterus Measurements: 9.2 x 4.5 x 6.1 cm. No fibroids or other mass visualized. Endometrium Thickness: 3 mm.  IUD partially visible. Right ovary Measurements: 2.1 x 2.0 x 1.6 cm. Several small follicles. Normal appearance/no adnexal mass. Left ovary Measurements: 3.7 x 3.0 x 4.4 cm. 2.6 cm simple, dominant cyst or  follicle. Normal appearance/no adnexal mass. Pulsed Doppler evaluation of both ovaries demonstrates normal low-resistance arterial and venous waveforms. Other findings No pelvic free fluid. IMPRESSION: Negative for ovarian torsion. Physiologic ultrasound appearance of both ovaries, and IUD partially visible. Electronically Signed   By: Genevie Ann M.D.   On: 05/25/2016 23:40   US Pelvis Complete  Result Date: 05/25/2016 CLINICAL DATA:  40 year old female with intermittent left lower quadrant pain and nausea for several days. Initial encounter. LMP 05/06/2016. EXAM: TRANSABDOMINAL AND TRANSVAGINAL ULTRASOUND OF PELVIS DOPPLER ULTRASOUND OF OVARIES TECHNIQUE: Both transabdominal and transvaginal ultrasound examinations of the pelvis were performed. Transabdominal technique was performed for global imaging of the pelvis including uterus, ovaries, adnexal regions, and pelvic cul-de-sac. It was necessary to proceed with endovaginal exam following the transabdominal exam to visualize the ovaries. Color and duplex  Doppler ultrasound was utilized to evaluate blood flow to the ovaries. COMPARISON:  CT Abdomen and Pelvis 11/04/2008. Report of [facility] lumbar radiographs 05/23/2016 (no images available). FINDINGS: Uterus Measurements: 9.2 x 4.5 x 6.1 cm. No fibroids or other mass visualized. Endometrium Thickness: 3 mm.  IUD partially visible. Right ovary Measurements: 2.1 x 2.0 x 1.6 cm. Several small follicles. Normal appearance/no adnexal mass. Left ovary Measurements: 3.7 x 3.0 x 4.4 cm. 2.6 cm simple, dominant cyst or follicle. Normal appearance/no adnexal mass. Pulsed Doppler evaluation of both ovaries demonstrates normal low-resistance arterial and venous waveforms. Other findings No pelvic free fluid. IMPRESSION: Negative for ovarian torsion. Physiologic ultrasound appearance of both ovaries, and IUD partially visible. Electronically Signed   By: Genevie Ann M.D.   On: 05/25/2016 23:40   [place] Art/ven Flow Abd Pelv Doppler  Result Date: 05/25/2016 CLINICAL DATA:  40 year old female with intermittent left lower quadrant pain and nausea for several days. Initial encounter. LMP 05/06/2016. EXAM: TRANSABDOMINAL AND TRANSVAGINAL ULTRASOUND OF PELVIS DOPPLER ULTRASOUND OF OVARIES TECHNIQUE: Both transabdominal and transvaginal ultrasound examinations of the pelvis were performed. Transabdominal technique was performed for global imaging of the pelvis including uterus, ovaries, adnexal regions, and pelvic cul-de-sac. It was necessary to proceed with endovaginal exam following the transabdominal exam to visualize the ovaries. Color and duplex Doppler ultrasound was utilized to evaluate blood flow to the ovaries. COMPARISON:  CT Abdomen and Pelvis 11/04/2008. Report of [facility] lumbar radiographs 05/23/2016 (no images available). FINDINGS: Uterus Measurements: 9.2 x 4.5 x 6.1 cm. No fibroids or other mass visualized. Endometrium Thickness: 3 mm.  IUD partially visible. Right ovary Measurements: 2.1 x 2.0 x 1.6 cm.  Several small follicles. Normal appearance/no adnexal mass. Left ovary Measurements: 3.7 x 3.0 x 4.4 cm. 2.6 cm simple, dominant cyst or follicle. Normal appearance/no adnexal mass. Pulsed Doppler evaluation of both ovaries demonstrates normal low-resistance arterial and venous waveforms. Other findings No pelvic free fluid. IMPRESSION: Negative for ovarian torsion. Physiologic ultrasound appearance of both ovaries, and IUD partially visible. Electronically Signed   By: Genevie Ann M.D.   On: 05/25/2016 23:40    Procedures Procedures (including critical care time)  Medications Ordered in ED Medications  cefTRIAXone (ROCEPHIN) injection 250 mg (not administered)  azithromycin (ZITHROMAX) tablet 1,000 mg (not administered)  ketorolac (TORADOL) injection 60 mg (not administered)     Initial Impression / Assessment and Plan / ED Course  I have reviewed the triage vital signs and the nursing notes.  Pertinent labs & imaging results that were available during my care of the patient were reviewed by me and considered in my medical decision making (see chart for details).  Clinical Course    BP 129/71   Pulse 69   Temp 98.1 F (36.7 C)   Resp 16   Ht 4\' 11"  (1.499 m)   Wt 67.2 kg   LMP 05/06/2016   SpO2 100%   BMI 29.94 kg/m    Final Clinical Impressions(s) / ED Diagnoses   Final diagnoses:  Pelvic pain in female  Cervicitis    New Prescriptions New Prescriptions   DOXYCYCLINE (VIBRAMYCIN) 100 MG CAPSULE    Take 1 capsule (100 mg total) by mouth 2 (two) times daily. One po bid x 7 days   NAPROXEN (NAPROSYN) 500 MG TABLET    Take 1 tablet (500 mg total) by mouth 2 (two) times daily.   10:50 PM Patient here with vaginal odor, vaginal discharge, and now acute onset of left pelvic pain. Does have left adnexal tenderness on exam. Patient has history of PCOS and have requested to have an ultrasound for further evaluation. A pelvic ultrasound ordered to rule out ovarian torsion or  tubo-ovarian abscess. Pain medication given.  11:53 PM UA shows 6-30 WBC and many bacteria. Wet prep shows many WBC. Since patient did not complain of any dysuria, I suspect her symptoms is likely cervicitis/PID since patient is sexually active and not using protection. A transvaginal ultrasound showing no evidence of ovarian torsion. Partially visualized IUD. No other concerning finding. Will treat patient with Rocephin and Zithromax in the ER, patient discharged with doxycycline as treatment for cervicitis. She will follow-up closely with her OB/GYN for further management. Return precaution discussed.   Domenic Moras, PA-C 05/26/16 0002    Tanna Furry, MD 06/07/16 [phone]

## 2016-05-25 NOTE — ED Notes (Signed)
Pt to US.

## 2016-05-25 NOTE — ED Triage Notes (Signed)
Patient reports intermittent LLQ pain with nausea for several days and malodorous yellow vaginal discharge this week . Denies fever or chills.

## 2016-05-26 DIAGNOSIS — N72 Inflammatory disease of cervix uteri: Secondary | ICD-10-CM | POA: Diagnosis not present

## 2016-05-26 LAB — GC/CHLAMYDIA PROBE AMP (~~LOC~~) NOT AT ARMC
Chlamydia: NEGATIVE
Neisseria Gonorrhea: NEGATIVE

## 2016-05-26 LAB — RAPID HIV SCREEN (HIV 1/2 AB+AG)
HIV 1/2 ANTIBODIES: NONREACTIVE
HIV-1 P24 ANTIGEN - HIV24: NONREACTIVE

## 2016-05-26 LAB — RPR: RPR Ser Ql: NONREACTIVE

## 2016-05-26 MED ORDER — LIDOCAINE HCL (PF) 1 % IJ SOLN
INTRAMUSCULAR | Status: AC
  Administered 2016-05-26: 5 mL
  Filled 2016-05-26: qty 5

## 2016-05-26 MED FILL — LEVOTHYROXINE 50 MCG TABLET: 50 | 90 days supply | Qty: 90 | Fill #0

## 2016-05-26 MED FILL — NAPROXEN 500 MG TABLET: 500 | 15 days supply | Qty: 30 | Fill #0

## 2016-05-26 MED FILL — DOXYCYCLINE HYCLATE 100 MG: 100 | 7 days supply | Qty: 14 | Fill #0

## 2016-06-02 DIAGNOSIS — N83202 Unspecified ovarian cyst, left side: Secondary | ICD-10-CM | POA: Diagnosis not present

## 2016-06-02 DIAGNOSIS — N920 Excessive and frequent menstruation with regular cycle: Secondary | ICD-10-CM | POA: Diagnosis not present

## 2016-06-02 DIAGNOSIS — Z3009 Encounter for other general counseling and advice on contraception: Secondary | ICD-10-CM | POA: Diagnosis not present

## 2016-06-08 ENCOUNTER — Encounter
Admission: RE | Admit: 2016-06-08 | Discharge: 2016-06-08 | Disposition: A | Discharge status: Home / Self Care | Payer: 59 | Source: Ambulatory Visit | Attending: Obstetrics & Gynecology | Admitting: Obstetrics & Gynecology

## 2016-06-08 HISTORY — DX: Hypothyroidism, unspecified: E03.9

## 2016-06-08 NOTE — Patient Instructions (Signed)
  Your procedure is scheduled on: 06-16-16 Report to Same Day Surgery 2nd floor medical mall To find out your arrival time please call [phone] between 1PM - 3PM on 06-15-16  Remember: Instructions that are not followed completely may result in serious medical risk, up to and including death, or upon the discretion of your surgeon and anesthesiologist your surgery may need to be rescheduled.    _x___ 1. Do not eat food or drink liquids after midnight. No gum chewing or hard candies.     __x__ 2. No Alcohol for 24 hours before or after surgery.   __x__3. No Smoking for 24 prior to surgery.   ____  4. Bring all medications with you on the day of surgery if instructed.    __x__ 5. Notify your doctor if there is any change in your medical condition     (cold, fever, infections).     Do not wear jewelry, make-up, hairpins, clips or nail polish.  Do not wear lotions, powders, or perfumes. You may wear deodorant.  Do not shave 48 hours prior to surgery. Men may shave face and neck.  Do not bring valuables to the hospital.    [facility] is not responsible for any belongings or valuables.               Contacts, dentures or bridgework may not be worn into surgery.  Leave your suitcase in the car. After surgery it may be brought to your room.  For patients admitted to the hospital, discharge time is determined by your treatment team.   Patients discharged the day of surgery will not be allowed to drive home.    Please read over the following fact sheets that you were given:   [facility] Preparing for Surgery and or MRSA Information   _x___ Take these medicines the morning of surgery with A SIP OF WATER:    1. levothyroxine  2.  3.  4.  5.  6.  ____ Fleet Enema (as directed)   ____ Use CHG Soap or sage wipes as directed on instruction sheet   ____ Use inhalers on the day of surgery and bring to hospital day of surgery  ____ Stop metformin 2 days prior to surgery    ____  Take 1/2 of usual insulin dose the night before surgery and none on the morning of surgery.   ____ Stop aspirin or coumadin, or plavix  _x__ Stop Anti-inflammatories such as Advil, Aleve, Ibuprofen, Motrin, Naproxen,          Naprosyn, Goodies powders or aspirin products. Ok to take Tylenol.   ____ Stop supplements until after surgery.    ____ Bring C-Pap to the hospital.

## 2016-06-13 NOTE — Patient Outreach (Signed)
[facility]) Care Management  06/13/2016  Gurnaaz Schwake [birth year] [id number]   I have discharge Latoya Logan from the [facility] since she no longer works for [employer].  I have sent her and Dr. Caryl Comes a discharge letter.   [name], RN, BA, [place], [place] Direct Dial:  [phone]  Fax:  [phone] E-mail: [email or web address] [address] [place], [place]  [address]

## 2016-06-16 ENCOUNTER — Ambulatory Visit: Payer: 59 | Admitting: Certified Registered Nurse Anesthetist

## 2016-06-16 ENCOUNTER — Encounter: Payer: Self-pay | Admitting: *Deleted

## 2016-06-16 ENCOUNTER — Ambulatory Visit
Admission: RE | Admit: 2016-06-16 | Discharge: 2016-06-16 | Disposition: A | Discharge status: Home / Self Care | Payer: 59 | Source: Ambulatory Visit | Attending: Obstetrics & Gynecology | Admitting: Obstetrics & Gynecology

## 2016-06-16 ENCOUNTER — Encounter
Admission: RE | Disposition: A | Discharge status: Home / Self Care | Payer: Self-pay | Source: Ambulatory Visit | Attending: Obstetrics & Gynecology

## 2016-06-16 DIAGNOSIS — G4730 Sleep apnea, unspecified: Secondary | ICD-10-CM | POA: Insufficient documentation

## 2016-06-16 DIAGNOSIS — E039 Hypothyroidism, unspecified: Secondary | ICD-10-CM | POA: Insufficient documentation

## 2016-06-16 DIAGNOSIS — R7303 Prediabetes: Secondary | ICD-10-CM | POA: Diagnosis not present

## 2016-06-16 DIAGNOSIS — Z302 Encounter for sterilization: Secondary | ICD-10-CM | POA: Insufficient documentation

## 2016-06-16 DIAGNOSIS — N838 Other noninflammatory disorders of ovary, fallopian tube and broad ligament: Secondary | ICD-10-CM | POA: Insufficient documentation

## 2016-06-16 DIAGNOSIS — Z79899 Other long term (current) drug therapy: Secondary | ICD-10-CM | POA: Insufficient documentation

## 2016-06-16 DIAGNOSIS — Z9049 Acquired absence of other specified parts of digestive tract: Secondary | ICD-10-CM | POA: Insufficient documentation

## 2016-06-16 DIAGNOSIS — K219 Gastro-esophageal reflux disease without esophagitis: Secondary | ICD-10-CM | POA: Insufficient documentation

## 2016-06-16 DIAGNOSIS — N921 Excessive and frequent menstruation with irregular cycle: Secondary | ICD-10-CM | POA: Diagnosis present

## 2016-06-16 DIAGNOSIS — N83202 Unspecified ovarian cyst, left side: Secondary | ICD-10-CM | POA: Diagnosis not present

## 2016-06-16 DIAGNOSIS — Z3009 Encounter for other general counseling and advice on contraception: Secondary | ICD-10-CM | POA: Diagnosis not present

## 2016-06-16 DIAGNOSIS — N711 Chronic inflammatory disease of uterus: Secondary | ICD-10-CM | POA: Insufficient documentation

## 2016-06-16 DIAGNOSIS — Z30432 Encounter for removal of intrauterine contraceptive device: Secondary | ICD-10-CM | POA: Diagnosis not present

## 2016-06-16 DIAGNOSIS — N920 Excessive and frequent menstruation with regular cycle: Secondary | ICD-10-CM | POA: Insufficient documentation

## 2016-06-16 HISTORY — PX: LAPAROSCOPIC TUBAL LIGATION: SHX1937

## 2016-06-16 HISTORY — PX: IUD REMOVAL: SHX5392

## 2016-06-16 HISTORY — PX: LAPAROSCOPIC OVARIAN CYSTECTOMY: SHX6248

## 2016-06-16 HISTORY — PX: HYSTEROSCOPY W/D&C: SHX1775

## 2016-06-16 LAB — CBC
HCT: 39.5 % (ref 35.0–47.0)
Hemoglobin: 13.6 g/dL (ref 12.0–16.0)
MCH: 32.2 pg (ref 26.0–34.0)
MCHC: 34.5 g/dL (ref 32.0–36.0)
MCV: 93.4 fL (ref 80.0–100.0)
PLATELETS: 210 10*3/uL (ref 150–440)
RBC: 4.23 MIL/uL (ref 3.80–5.20)
RDW: 12.6 % (ref 11.5–14.5)
WBC: 4.6 10*3/uL (ref 3.6–11.0)

## 2016-06-16 LAB — TYPE AND SCREEN
ABO/RH(D): O POS
ANTIBODY SCREEN: NEGATIVE

## 2016-06-16 LAB — POCT PREGNANCY, URINE: Preg Test, Ur: NEGATIVE

## 2016-06-16 SURGERY — DILATATION AND CURETTAGE /HYSTEROSCOPY
Anesthesia: General | Site: Vagina | Wound class: Clean Contaminated

## 2016-06-16 MED ORDER — FAMOTIDINE 20 MG PO TABS
20.0000 mg | ORAL_TABLET | Freq: Once | ORAL | Status: AC
  Administered 2016-06-16: 20 mg via ORAL

## 2016-06-16 MED ORDER — DEXAMETHASONE SODIUM PHOSPHATE 10 MG/ML IJ SOLN
INTRAMUSCULAR | Status: DC | PRN
  Administered 2016-06-16: 10 mg via INTRAVENOUS

## 2016-06-16 MED ORDER — FENTANYL CITRATE (PF) 100 MCG/2ML IJ SOLN
INTRAMUSCULAR | Status: DC | PRN
  Administered 2016-06-16: 100 ug via INTRAVENOUS

## 2016-06-16 MED ORDER — KETOROLAC TROMETHAMINE 30 MG/ML IJ SOLN
INTRAMUSCULAR | Status: DC | PRN
Start: 2016-06-16 — End: 2016-06-16
  Administered 2016-06-16: 30 mg via INTRAVENOUS

## 2016-06-16 MED ORDER — SUCCINYLCHOLINE CHLORIDE 20 MG/ML IJ SOLN
INTRAMUSCULAR | Status: DC | PRN
  Administered 2016-06-16: 80 mg via INTRAVENOUS

## 2016-06-16 MED ORDER — FENTANYL CITRATE (PF) 100 MCG/2ML IJ SOLN
INTRAMUSCULAR | Status: AC
  Administered 2016-06-16: 25 ug via INTRAVENOUS
  Filled 2016-06-16: qty 2

## 2016-06-16 MED ORDER — LIDOCAINE HCL (CARDIAC) 20 MG/ML IV SOLN
INTRAVENOUS | Status: DC | PRN
  Administered 2016-06-16: 60 mg via INTRAVENOUS

## 2016-06-16 MED ORDER — FERRIC SUBSULFATE 259 MG/GM EX SOLN
CUTANEOUS | Status: AC
  Filled 2016-06-16: qty 8

## 2016-06-16 MED ORDER — SUGAMMADEX SODIUM 200 MG/2ML IV SOLN
INTRAVENOUS | Status: DC | PRN
  Administered 2016-06-16: 134.2 mg via INTRAVENOUS

## 2016-06-16 MED ORDER — ACETAMINOPHEN 10 MG/ML IV SOLN
INTRAVENOUS | Status: DC | PRN
  Administered 2016-06-16: 1000 mg via INTRAVENOUS

## 2016-06-16 MED ORDER — BUPIVACAINE HCL (PF) 0.5 % IJ SOLN
INTRAMUSCULAR | Status: AC
  Filled 2016-06-16: qty 30

## 2016-06-16 MED ORDER — PROMETHAZINE HCL 25 MG/ML IJ SOLN
INTRAMUSCULAR | Status: AC
  Administered 2016-06-16: 12.5 mg via INTRAVENOUS
  Filled 2016-06-16: qty 1

## 2016-06-16 MED ORDER — PHENYLEPHRINE HCL 10 MG/ML IJ SOLN
INTRAMUSCULAR | Status: DC | PRN
  Administered 2016-06-16: 100 ug via INTRAVENOUS

## 2016-06-16 MED ORDER — PROMETHAZINE HCL 25 MG/ML IJ SOLN
6.2500 mg | INTRAMUSCULAR | Status: DC | PRN
  Administered 2016-06-16: 12.5 mg via INTRAVENOUS

## 2016-06-16 MED ORDER — LACTATED RINGERS IV SOLN
INTRAVENOUS | Status: DC
  Administered 2016-06-16: 14:00:00 via INTRAVENOUS

## 2016-06-16 MED ORDER — FENTANYL CITRATE (PF) 100 MCG/2ML IJ SOLN
INTRAMUSCULAR | Status: AC
  Administered 2016-06-16: 50 ug via INTRAVENOUS
  Filled 2016-06-16: qty 2

## 2016-06-16 MED ORDER — PROPOFOL 10 MG/ML IV BOLUS
INTRAVENOUS | Status: DC | PRN
  Administered 2016-06-16: 140 mg via INTRAVENOUS

## 2016-06-16 MED ORDER — SODIUM CHLORIDE 0.9 % IJ SOLN
INTRAMUSCULAR | Status: DC
Start: 2016-06-16 — End: 2016-06-16
  Filled 2016-06-16: qty 10

## 2016-06-16 MED ORDER — ONDANSETRON HCL 4 MG/2ML IJ SOLN
INTRAMUSCULAR | Status: DC | PRN
  Administered 2016-06-16: 4 mg via INTRAVENOUS

## 2016-06-16 MED ORDER — ROCURONIUM BROMIDE 100 MG/10ML IV SOLN
INTRAVENOUS | Status: DC | PRN
  Administered 2016-06-16: 30 mg via INTRAVENOUS

## 2016-06-16 MED ORDER — FENTANYL CITRATE (PF) 100 MCG/2ML IJ SOLN
25.0000 ug | INTRAMUSCULAR | Status: DC | PRN
  Administered 2016-06-16: 50 ug via INTRAVENOUS
  Administered 2016-06-16 (×2): 25 ug via INTRAVENOUS

## 2016-06-16 MED ORDER — BUPIVACAINE HCL 0.5 % IJ SOLN
INTRAMUSCULAR | Status: DC | PRN
  Administered 2016-06-16: 10 mL

## 2016-06-16 MED ORDER — FAMOTIDINE 20 MG PO TABS
ORAL_TABLET | ORAL | Status: AC
  Administered 2016-06-16: 20 mg via ORAL
  Filled 2016-06-16: qty 1

## 2016-06-16 MED ORDER — SILVER NITRATE-POT NITRATE 75-25 % EX MISC
CUTANEOUS | Status: AC
  Filled 2016-06-16: qty 16

## 2016-06-16 MED ORDER — ACETAMINOPHEN 10 MG/ML IV SOLN
INTRAVENOUS | Status: AC
  Filled 2016-06-16: qty 100

## 2016-06-16 MED ORDER — MIDAZOLAM HCL 2 MG/2ML IJ SOLN
INTRAMUSCULAR | Status: DC | PRN
  Administered 2016-06-16: 2 mg via INTRAVENOUS

## 2016-06-16 MED ORDER — HYDROCODONE-ACETAMINOPHEN 5-325 MG PO TABS: 1.0000 | ORAL_TABLET | Freq: Four times a day (QID) | ORAL | 0 refills | Status: DC | PRN

## 2016-06-16 SURGICAL SUPPLY — 55 items
ABLATOR ENDOMETRIAL MYOSURE (ABLATOR) ×6 IMPLANT
BAG COUNTER SPONGE EZ (MISCELLANEOUS) ×5 IMPLANT
BLADE SURG SZ11 CARB STEEL (BLADE) ×6 IMPLANT
CANISTER SUC SOCK COL 7IN (MISCELLANEOUS) ×6 IMPLANT
CANISTER SUCT 1200ML W/VALVE (MISCELLANEOUS) ×6 IMPLANT
CATH ROBINSON RED A/P 16FR (CATHETERS) ×6 IMPLANT
CHLORAPREP W/TINT 26ML (MISCELLANEOUS) ×6 IMPLANT
CLIP HULKA (CLIP) ×12 IMPLANT
COUNTER SPONGE BAG EZ (MISCELLANEOUS) ×1
DEVICE MYOSURE LITE (MISCELLANEOUS) ×6 IMPLANT
DRESSING TELFA 4X3 1S ST N-ADH (GAUZE/BANDAGES/DRESSINGS) IMPLANT
DRSG TEGADERM 2-3/8X2-3/4 SM (GAUZE/BANDAGES/DRESSINGS) ×30 IMPLANT
DRSG TELFA 3X8 NADH (GAUZE/BANDAGES/DRESSINGS) ×6 IMPLANT
ELECT REM PT RETURN 9FT ADLT (ELECTROSURGICAL) ×6
ELECTRODE REM PT RTRN 9FT ADLT (ELECTROSURGICAL) ×4 IMPLANT
ENDOPOUCH RETRIEVER 10 (MISCELLANEOUS) IMPLANT
GAUZE SPONGE NON-WVN 2X2 STRL (MISCELLANEOUS) ×20 IMPLANT
GLOVE BIO SURGEON STRL SZ7.5 (GLOVE) ×6 IMPLANT
GLOVE BIO SURGEON STRL SZ8 (GLOVE) ×6 IMPLANT
GLOVE INDICATOR 8.0 STRL GRN (GLOVE) ×6 IMPLANT
GOWN STRL REUS W/ TWL LRG LVL3 (GOWN DISPOSABLE) ×4 IMPLANT
GOWN STRL REUS W/ TWL XL LVL3 (GOWN DISPOSABLE) ×4 IMPLANT
GOWN STRL REUS W/TWL LRG LVL3 (GOWN DISPOSABLE) ×2
GOWN STRL REUS W/TWL XL LVL3 (GOWN DISPOSABLE) ×2
IRRIGATION STRYKERFLOW (MISCELLANEOUS) IMPLANT
IRRIGATOR STRYKERFLOW (MISCELLANEOUS)
IV LACTATED RINGERS 1000ML (IV SOLUTION) IMPLANT
KIT PINK PAD W/HEAD ARE REST (MISCELLANEOUS) ×6
KIT PINK PAD W/HEAD ARM REST (MISCELLANEOUS) ×4 IMPLANT
LABEL OR SOLS (LABEL) ×6 IMPLANT
LIQUID BAND (GAUZE/BANDAGES/DRESSINGS) ×6 IMPLANT
NEEDLE VERESS 14GA 120MM (NEEDLE) ×6 IMPLANT
NS IRRIG 500ML POUR BTL (IV SOLUTION) ×6 IMPLANT
PACK DNC HYST (MISCELLANEOUS) ×6 IMPLANT
PACK GYN LAPAROSCOPIC (MISCELLANEOUS) ×6 IMPLANT
PAD OB MATERNITY 4.3X12.25 (PERSONAL CARE ITEMS) ×6 IMPLANT
PAD PREP 24X41 OB/GYN DISP (PERSONAL CARE ITEMS) ×6 IMPLANT
SCISSORS METZENBAUM CVD 33 (INSTRUMENTS) ×6 IMPLANT
SHEARS HARMONIC ACE PLUS 36CM (ENDOMECHANICALS) IMPLANT
SLEEVE ENDOPATH XCEL 5M (ENDOMECHANICALS) IMPLANT
SOL .9 NS 3000ML IRR  AL (IV SOLUTION) ×2
SOL .9 NS 3000ML IRR UROMATIC (IV SOLUTION) ×4 IMPLANT
SPONGE VERSALON 2X2 STRL (MISCELLANEOUS) ×10
STRAP SAFETY BODY (MISCELLANEOUS) ×6 IMPLANT
SUT VIC AB 2-0 UR6 27 (SUTURE) ×6 IMPLANT
SUT VIC AB 4-0 PS2 18 (SUTURE) ×12 IMPLANT
SYR 50ML LL SCALE MARK (SYRINGE) ×6 IMPLANT
SYRINGE 10CC LL (SYRINGE) ×12 IMPLANT
TOWEL OR 17X26 4PK STRL BLUE (TOWEL DISPOSABLE) ×6 IMPLANT
TROCAR ENDO BLADELESS 11MM (ENDOMECHANICALS) ×6 IMPLANT
TROCAR XCEL NON-BLD 5MMX100MML (ENDOMECHANICALS) ×6 IMPLANT
TUBING CONNECTING 10 (TUBING) ×5 IMPLANT
TUBING CONNECTING 10' (TUBING) ×1
TUBING HYSTEROSCOPY DOLPHIN (MISCELLANEOUS) IMPLANT
TUBING INSUFFLATOR HI FLOW (MISCELLANEOUS) ×6 IMPLANT

## 2016-06-16 NOTE — Transfer of Care (Signed)
Immediate Anesthesia Transfer of Care Note  Patient: Latoya Logan  Procedure(s) Performed: Procedure(s): DILATATION AND CURETTAGE /HYSTEROSCOPY (N/A) LAPAROSCOPIC TUBAL LIGATION (Bilateral) LAPAROSCOPIC OVARIAN CYSTECTOMY (Left) INTRAUTERINE DEVICE (IUD) REMOVAL (N/A)  Patient Location: PACU  Anesthesia Type:General  Level of Consciousness: awake, alert  and oriented  Airway & Oxygen Therapy: Patient Spontanous Breathing and Patient connected to face mask oxygen  Post-op Assessment: Report given to RN and Post -op Vital signs reviewed and stable  Post vital signs: Reviewed and stable  Last Vitals:  Vitals:   06/16/16 1249  BP: (!) 145/96  Pulse: 66  Resp: 16  Temp: 36.7 C    Last Pain:  Vitals:   06/16/16 1249  TempSrc: Oral         Complications: No apparent anesthesia complications

## 2016-06-16 NOTE — Anesthesia Procedure Notes (Signed)
Procedure Name: Intubation Performed by: Korde Jeppsen Pre-anesthesia Checklist: Patient identified, Patient being monitored, Timeout performed, Emergency Drugs available and Suction available Patient Re-evaluated:Patient Re-evaluated prior to inductionOxygen Delivery Method: Circle system utilized Preoxygenation: Pre-oxygenation with 100% oxygen Intubation Type: IV induction Ventilation: Mask ventilation without difficulty Laryngoscope Size: Mac and 3 Grade View: Grade II Tube type: Oral Tube size: 7.0 mm Number of attempts: 1 Airway Equipment and Method: Stylet Placement Confirmation: ETT inserted through vocal cords under direct vision,  positive ETCO2 and breath sounds checked- equal and bilateral Secured at: 21 cm Tube secured with: Tape Dental Injury: Teeth and Oropharynx as per pre-operative assessment        

## 2016-06-16 NOTE — Discharge Instructions (Signed)
Hysteroscopy, Care After Refer to this sheet in the next few weeks. These instructions provide you with information on caring for yourself after your procedure. Your health care provider may also give you more specific instructions. Your treatment has been planned according to current medical practices, but problems sometimes occur. Call your health care provider if you have any problems or questions after your procedure.  WHAT TO EXPECT AFTER THE PROCEDURE After your procedure, it is typical to have the following:  You may have some cramping. This normally lasts for a couple days.  You may have bleeding. This can vary from light spotting for a few days to menstrual-like bleeding for 3-7 days. HOME CARE INSTRUCTIONS  Rest for the first 1-2 days after the procedure.  Only take over-the-counter or prescription medicines as directed by your health care provider. Do not take aspirin. It can increase the chances of bleeding.  Take showers instead of baths for 2 weeks or as directed by your health care provider.  Do not drive for 24 hours or as directed.  Do not drink alcohol while taking pain medicine.  Do not use tampons, douche, or have sexual intercourse for 2 weeks or until your health care provider says it is okay.  Take your temperature twice a day for 4-5 days. Write it down each time.  Follow your health care provider's advice about diet, exercise, and lifting.  If you develop constipation, you may:  Take a mild laxative if your health care provider approves.  Add bran foods to your diet.  Drink enough fluids to keep your urine clear or pale yellow.  Try to have someone with you or available to you for the first 24-48 hours, especially if you were given a general anesthetic.  Follow up with your health care provider as directed. SEEK MEDICAL CARE IF:  You feel dizzy or lightheaded.  You feel sick to your stomach (nauseous).  You have abnormal vaginal discharge.  You  have a rash.  You have pain that is not controlled with medicine. SEEK IMMEDIATE MEDICAL CARE IF:  You have bleeding that is heavier than a normal menstrual period.  You have a fever.  You have increasing cramps or pain, not controlled with medicine.  You have new belly (abdominal) pain.  You pass out.  You have pain in the tops of your shoulders (shoulder strap areas).  You have shortness of breath.   This information is not intended to replace advice given to you by your health care provider. Make sure you discuss any questions you have with your health care provider.   Document Released: 07/31/2013 Document Reviewed: 07/31/2013 Elsevier Interactive Patient Education 2016 [place]   1) The drugs that you were given will stay in your system until tomorrow so for the next 24 hours you should not:  A) Drive an automobile B) Make any legal decisions C) Drink any alcoholic beverage   2) You may resume regular meals tomorrow.  Today it is better to start with liquids and gradually work up to solid foods.  You may eat anything you prefer, but it is better to start with liquids, then soup and crackers, and gradually work up to solid foods.   3) Please notify your doctor immediately if you have any unusual bleeding, trouble breathing, redness and pain at the surgery site, drainage, fever, or pain not relieved by medication.    4) Additional Instructions:    Please contact your physician  with any problems or Same Day Surgery at [phone], Monday through Friday 6 am to 4 pm, or [place] at [facility] number at [phone].

## 2016-06-16 NOTE — Anesthesia Postprocedure Evaluation (Signed)
Anesthesia Post Note  Patient: Latoya Logan  Procedure(s) Performed: Procedure(s) (LRB): DILATATION AND CURETTAGE /HYSTEROSCOPY (N/A) LAPAROSCOPIC TUBAL LIGATION (Bilateral) LAPAROSCOPIC OVARIAN CYSTECTOMY (Left) INTRAUTERINE DEVICE (IUD) REMOVAL (N/A)  Patient location during evaluation: PACU Anesthesia Type: General Level of consciousness: awake and alert and oriented Pain management: pain level controlled Vital Signs Assessment: post-procedure vital signs reviewed and stable Respiratory status: spontaneous breathing, nonlabored ventilation and respiratory function stable Cardiovascular status: blood pressure returned to baseline and stable Postop Assessment: no signs of nausea or vomiting Anesthetic complications: no    Last Vitals:  Vitals:   06/16/16 1605 06/16/16 1621  BP: 115/79 120/80  Pulse: 64 63  Resp: 16 11  Temp:      Last Pain:  Vitals:   06/16/16 1621  TempSrc:   PainSc: 4                  Myron Stankovich

## 2016-06-16 NOTE — H&P (Signed)
History and Physical Interval Note:  06/16/2016 12:05 PM  Latoya Logan  has presented today for surgery, with the diagnosis of desire for permanent sterility,left ovarian cyst,menometrorrhagia  The various methods of treatment have been discussed with the patient and family. After consideration of risks, benefits and other options for treatment, the patient has consented to  Procedure(s): DILATATION AND CURETTAGE /HYSTEROSCOPY (N/A) LAPAROSCOPIC TUBAL LIGATION (Bilateral) LAPAROSCOPIC OVARIAN CYSTECTOMY (Left) INTRAUTERINE DEVICE (IUD) REMOVAL (N/A) as a surgical intervention .  The patient's history has been reviewed, patient examined, no change in status, stable for surgery.  Pt has the following beta blocker history-  Not taking Beta Blocker.  I have reviewed the patient's chart and labs.  Questions were answered to the patient's satisfaction.       Hoyt Koch

## 2016-06-16 NOTE — Anesthesia Preprocedure Evaluation (Signed)
Anesthesia Evaluation  Patient identified by MRN, date of birth, ID band Patient awake    Reviewed: Allergy & Precautions, H&P , NPO status , Patient's Chart, lab work & pertinent test results, reviewed documented beta blocker date and time   History of Anesthesia Complications Negative for: history of anesthetic complications  Airway Mallampati: IV  TM Distance: >3 FB Neck ROM: full    Dental no notable dental hx. (+) Caps   Pulmonary neg shortness of breath, sleep apnea , neg COPD, neg recent URI,    Pulmonary exam normal breath sounds clear to auscultation       Cardiovascular Exercise Tolerance: Good negative cardio ROS Normal cardiovascular exam Rhythm:regular Rate:Normal     Neuro/Psych negative neurological ROS  negative psych ROS   GI/Hepatic negative GI ROS, NAFLD   Endo/Other  neg diabetesHypothyroidism   Renal/GU negative Renal ROS  negative genitourinary   Musculoskeletal   Abdominal   Peds  Hematology negative hematology ROS (+)   Anesthesia Other Findings Past Medical History: No date: Bacterial vaginosis 2010: Fatty liver No date: Hypothyroidism 2015: Pre-diabetes 2016: Sleep apnea     Comment: no cpap No date: Thyroid disease   Reproductive/Obstetrics negative OB ROS                             Anesthesia Physical Anesthesia Plan  ASA: II  Anesthesia Plan: General   Post-op Pain Management:    Induction:   Airway Management Planned:   Additional Equipment:   Intra-op Plan:   Post-operative Plan:   Informed Consent: I have reviewed the patients History and Physical, chart, labs and discussed the procedure including the risks, benefits and alternatives for the proposed anesthesia with the patient or authorized representative who has indicated his/her understanding and acceptance.   Dental Advisory Given  Plan Discussed with: Anesthesiologist, CRNA  and Surgeon  Anesthesia Plan Comments:         Anesthesia Quick Evaluation

## 2016-06-16 NOTE — Op Note (Signed)
Operative Note   06/16/2016  PRE-OP DIAGNOSIS: Menorrhagia, Desire for sterility, Ovarian cyst   POST-OP DIAGNOSIS: same   SURGEON: Barnett Applebaum, MD, FACOG   PROCEDURE: Procedure(s): DILATATION AND CURETTAGE /HYSTEROSCOPY ENDOMETRIAL ABLATION (NOVASURE) LAPAROSCOPIC TUBAL LIGATION by BILATERAL SALPINGECTOMY LAPAROSCOPIC OVARIAN CYST ASPIRATION (BILATERAL) INTRAUTERINE DEVICE (IUD) REMOVAL  ANESTHESIA: General   ESTIMATED BLOOD LOSS: min   SPECIMENS: Bilateral tubes    EMC  FLUID DEFICIT: min   COMPLICATIONS: none   DISPOSITION: PACU - hemodynamically stable.   CONDITION: stable   FINDINGS: Exam under anesthesia revealed small, mobile small uterus with no masses and bilateral adnexa without masses or fullness. Hysteroscopy revealed proliferative lining without fibroid or polyp, otherwise grossly normal appearing uterine cavity with bilateral tubal ostia and normal appearing endocervical canal. Laparoscopy revealed normal tubes, uterus. Bilateral simple appearing small ovarian cysts (L>R).   PROCEDURE IN DETAIL: After informed consent was obtained, the patient was taken to the operating room where anesthesia was obtained without difficulty. The patient was positioned in the dorsal lithotomy position in [place]. The patient's bladder was catheterized with an in and out foley catheter. The patient was examined under anesthesia, with the above noted findings. The weightedspeculum was placed inside the patient's vagina, and the the anterior lip of the cervix was seen and grasped with the tenaculum.  The uterine cavity was sounded to 8cm and a H[place] Tenaculum was placed.  Attention is then turned to the abdomen where a veress needle is placed through an infraumbilical incision after Marcaine is used to anesthesize the skin.  Veress needle placement is confirmed using the hanging drop technique and the abdomen is then insufflated w CO2 gas.  A 35mm trocar is then placed under direct  visualization with the laparoscope and the above mentioned findings seen.  An additional 71mm  trocar is placed RLQ lateral to the infecrior epigastric blood vessels, and also a 31mm trocar in the suprapubic region under direct visualization with the laparoscope..  The left fallopian tube is grasped and dissected free using the Harmonic scapel along the mesosalpinx with preservation of the main ovarian arterial blood flow to the ovary.  The same is performed on the right.  Bilateral ovarian cysts are aspirated of clear fluid.  Hemostasis is assured.  Gas is expelled and trocars removed with skin closure using Dermabond.  Return to vaginal approach.  The cervix was progressively dilated to a 18 French-Pratt dilator. The 30 degree hysteroscope was introduced, with LR fluid used to distend the intrauterine cavity, with the above noted findings. Hysteroscope removed.   The hystersocope was removed and the uterine cavity was curetted until a gritty texture was noted, yielding endometrial curettings. Excellent hemostasis was noted.   The NovaSure device was then placed without difficulty. Measurements were obtained. Patient was noted to have a uterine length of 9cm, a cervical length of 3.5 cm, and a uterine width of 3 cm. The NovaSure device is first tested and after confirmation the procedures performed. Length of procedure was 86 seconds.  The NovaSure device is then removed and repeat hysteroscopy reveals an appropriate lining of the uterus and no perforation or injury. Hysteroscope is removed with minimal discrepancy of fluid.    Tenaculum was removed with excellent hemostasis noted. She was then taken out of dorsal lithotomy. Minimal discrepancy in fluid was noted. No bleeding.  The patient tolerated the procedure well. Sponge, lap and needle counts were correct x2. The patient was taken to recovery room in excellent condition.

## 2016-06-17 ENCOUNTER — Encounter: Payer: Self-pay | Admitting: Obstetrics & Gynecology

## 2016-06-21 ENCOUNTER — Ambulatory Visit: Payer: 59

## 2016-06-21 LAB — SURGICAL PATHOLOGY

## 2016-12-10 IMAGING — CT CT MAXILLOFACIAL W/O CM
1 series · 16 of 30 positions shown, 20 images · non-contrast
Comparison: None.

CLINICAL DATA: Nasal trauma.

EXAM:
CT MAXILLOFACIAL WITHOUT CONTRAST
TECHNIQUE: Multidetector CT imaging of the maxillofacial structures was
performed. Multiplanar CT image reconstructions were also generated.
A small metallic BB was placed on the right temple in order to
reliably differentiate right from left.

[Series 2: max soft · axial · 0.32mm/px · z∈[+342,+488]mm · 16 of 79 slices shown, 20 images]
[im 3/79  brain]
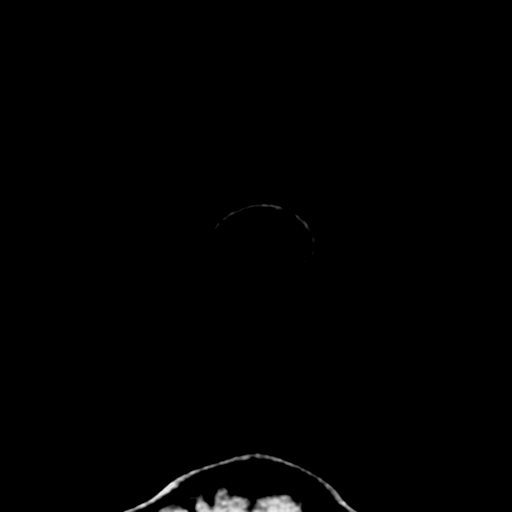
[im 3/79  bone]
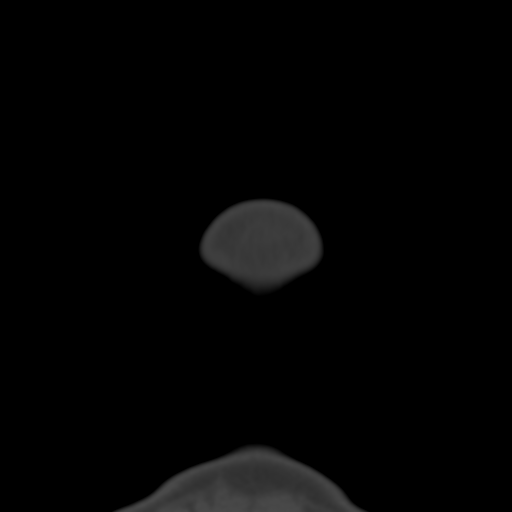
[im 9/79  bone]
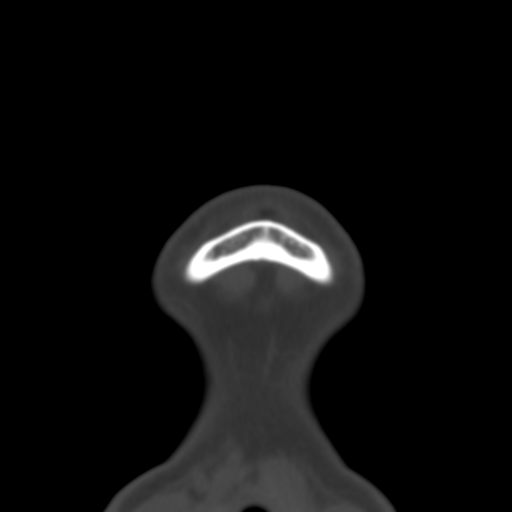
[im 14/79  bone]
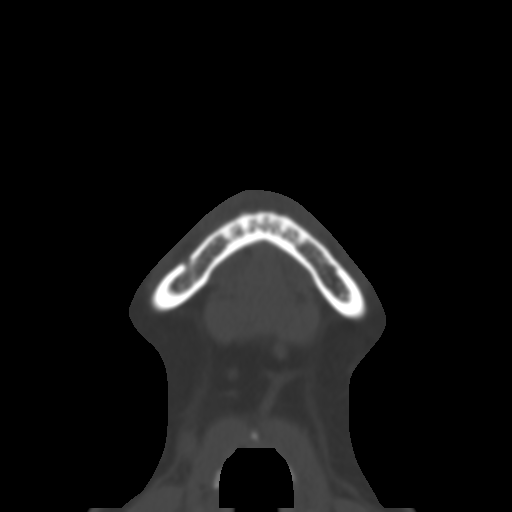
[im 17/79  bone]
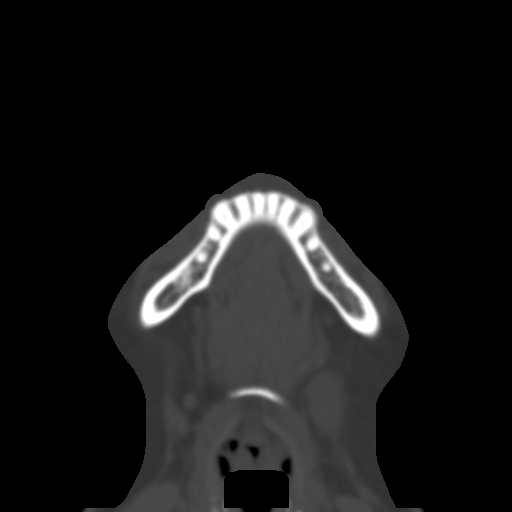
[im 22/79  brain]
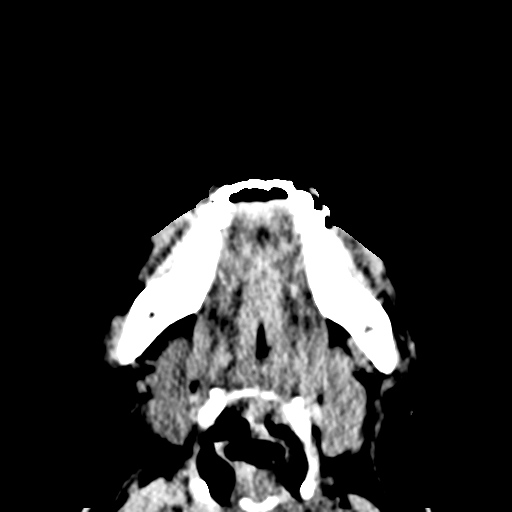
[im 22/79  bone]
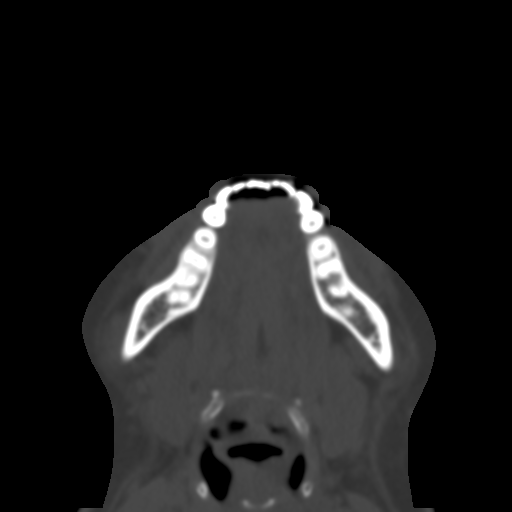
[im 30/79  bone]
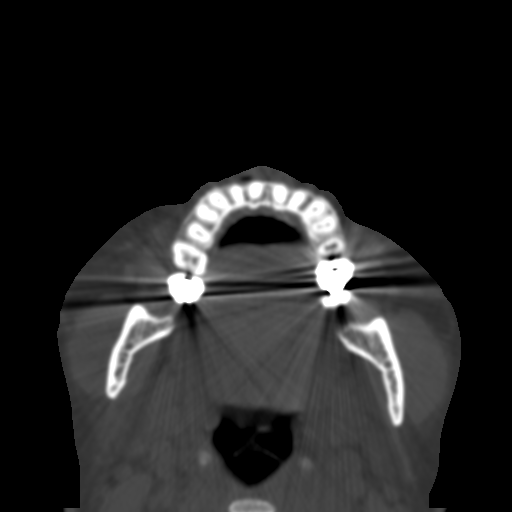
[im 33/79  bone]
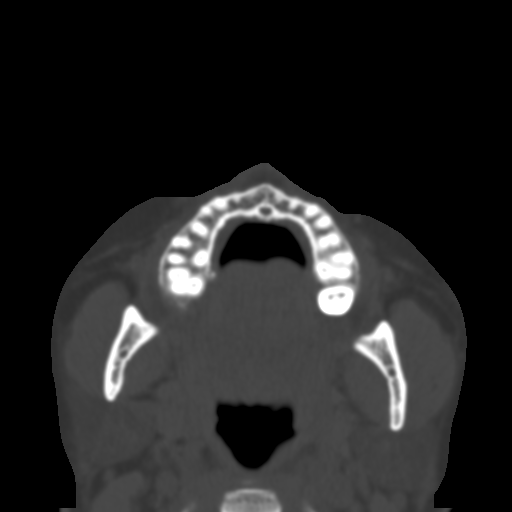
[im 38/79  bone]
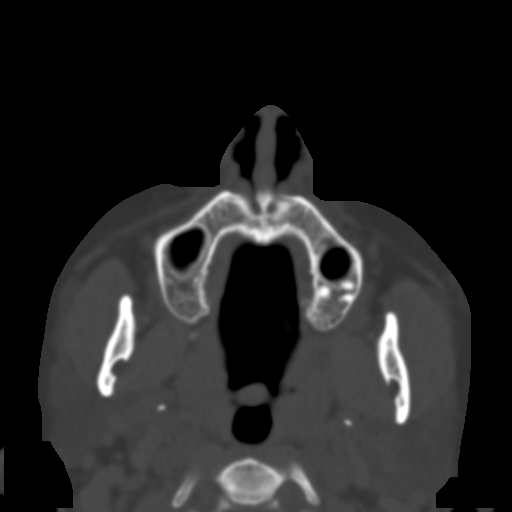
[im 44/79  brain]
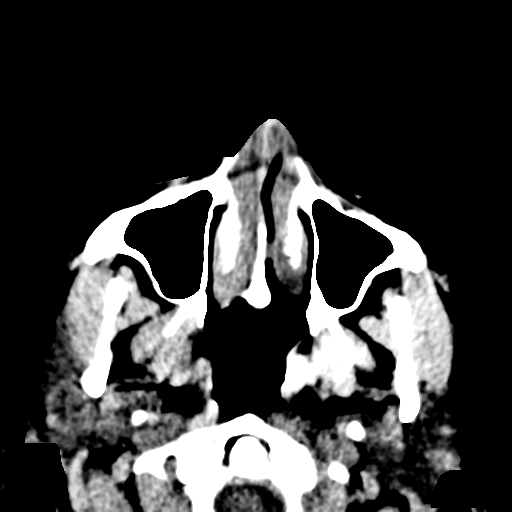
[im 44/79  bone]
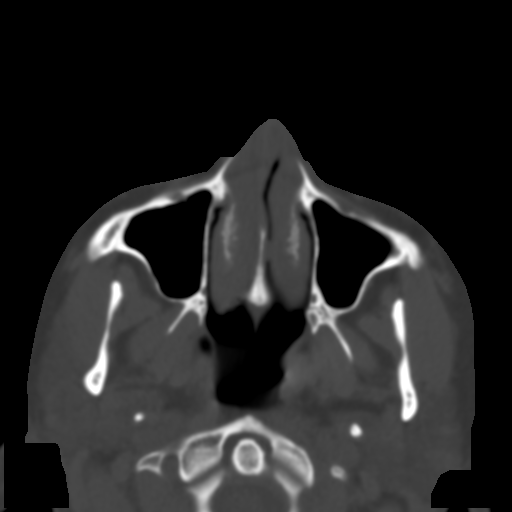
[im 49/79  bone]
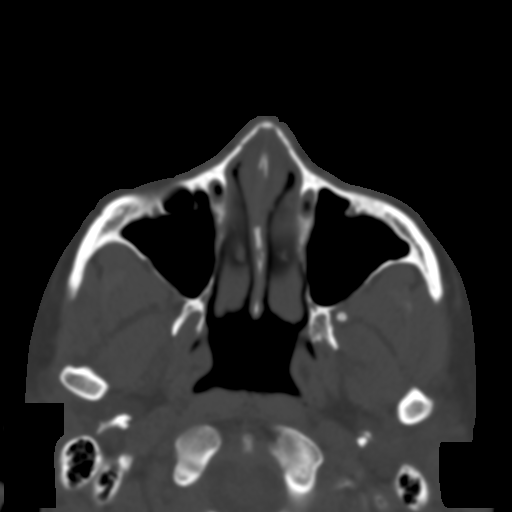
[im 52/79  bone]
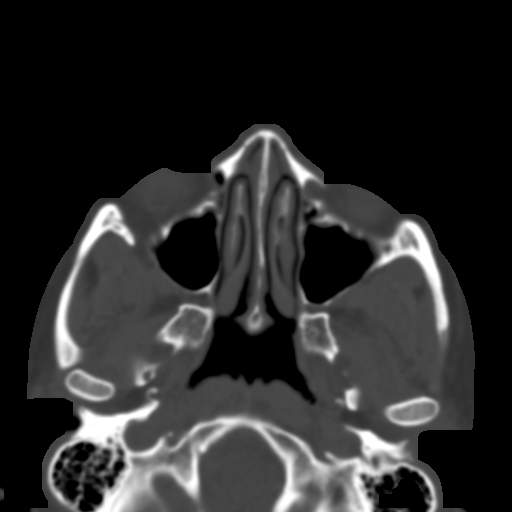
[im 57/79  bone]
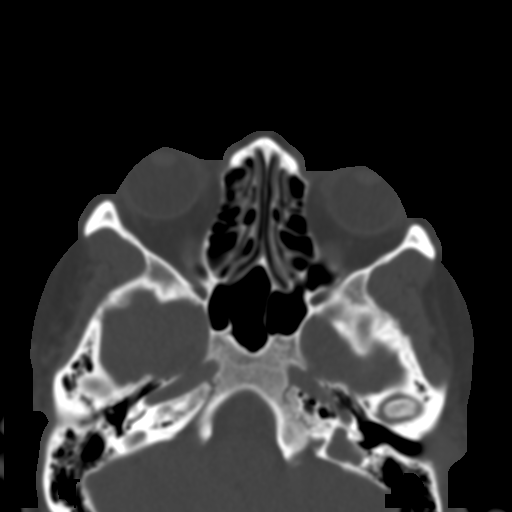
[im 62/79  brain]
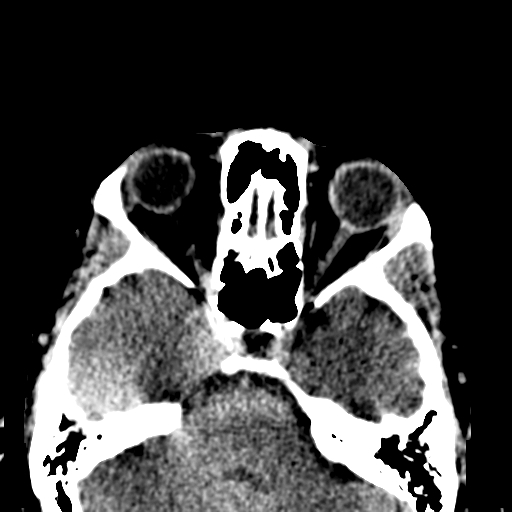
[im 62/79  bone]
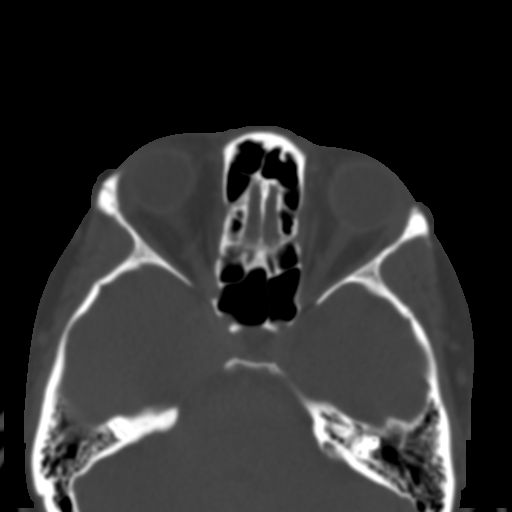
[im 65/79  bone]
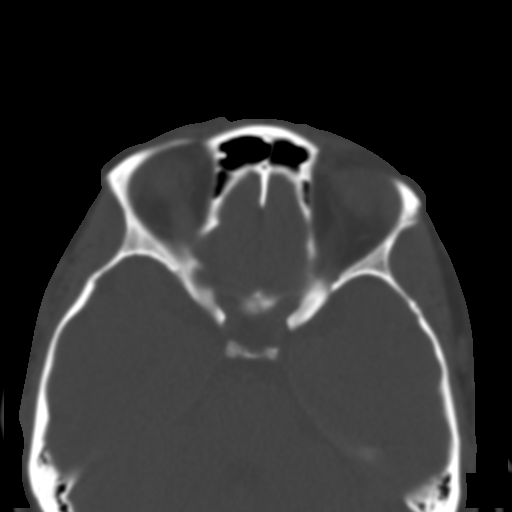
[im 70/79  bone]
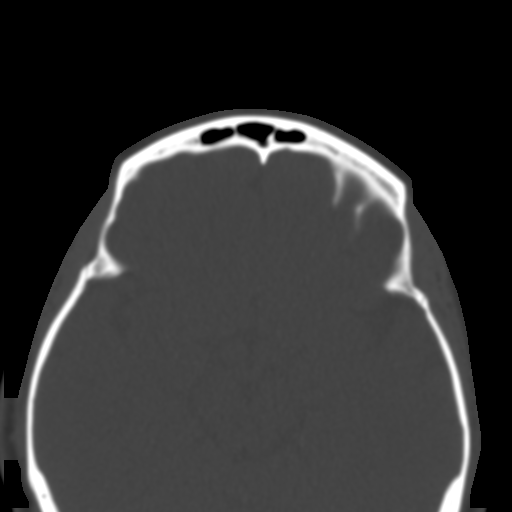
[im 76/79  bone]
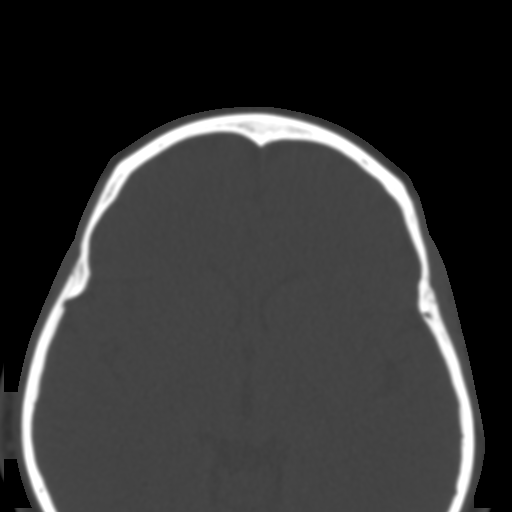

[16 of 30 positions shown; findings below may reference images not displayed]

FINDINGS: The paranasal sinuses appear clear. The nasal bone is intact. The
nasal septum is midline. No evidence for orbital blowout fracture.
The mandible appears located and is intact. The visualized
intracranial contents appear normal.
IMPRESSION: 1. Negative for facial bone fracture.

## 2017-01-16 ENCOUNTER — Telehealth: Payer: Self-pay

## 2017-01-16 ENCOUNTER — Other Ambulatory Visit: Payer: Self-pay | Admitting: Obstetrics & Gynecology

## 2017-01-16 DIAGNOSIS — Z1231 Encounter for screening mammogram for malignant neoplasm of breast: Secondary | ICD-10-CM

## 2017-01-16 NOTE — Telephone Encounter (Signed)
Pt called triage line stating she has been having reoccurance of BV. [place] has given her Metronidazole in the past for her to take. It seems it is not helping any longer. Please call patient and schedule an appointment with [facility] at first available. Thanks CB# [phone]

## 2017-01-16 NOTE — Telephone Encounter (Signed)
Scheduled

## 2017-01-18 ENCOUNTER — Encounter: Payer: Self-pay | Admitting: Obstetrics & Gynecology

## 2017-01-18 ENCOUNTER — Ambulatory Visit (INDEPENDENT_AMBULATORY_CARE_PROVIDER_SITE_OTHER): Payer: PRIVATE HEALTH INSURANCE | Admitting: Obstetrics & Gynecology

## 2017-01-18 VITALS — BP 120/70 | HR 88 | Ht 60.0 in | Wt 160.0 lb

## 2017-01-18 DIAGNOSIS — B373 Candidiasis of vulva and vagina: Secondary | ICD-10-CM

## 2017-01-18 DIAGNOSIS — N760 Acute vaginitis: Secondary | ICD-10-CM | POA: Diagnosis not present

## 2017-01-18 DIAGNOSIS — B9689 Other specified bacterial agents as the cause of diseases classified elsewhere: Secondary | ICD-10-CM

## 2017-01-18 DIAGNOSIS — B3731 Acute candidiasis of vulva and vagina: Secondary | ICD-10-CM

## 2017-01-18 DIAGNOSIS — Z1239 Encounter for other screening for malignant neoplasm of breast: Secondary | ICD-10-CM

## 2017-01-18 DIAGNOSIS — Z1231 Encounter for screening mammogram for malignant neoplasm of breast: Secondary | ICD-10-CM | POA: Diagnosis not present

## 2017-01-18 MED ORDER — FLUCONAZOLE 150 MG PO TABS: 150.0000 mg | ORAL_TABLET | Freq: Once | ORAL | 1 refills | Status: AC

## 2017-01-18 MED ORDER — METRONIDAZOLE 500 MG PO TABS: 500.0000 mg | ORAL_TABLET | Freq: Every day | ORAL | 4 refills | Status: DC

## 2017-01-18 NOTE — Progress Notes (Signed)
HPI:      [name] is a 41 y.o. [phone] who LMP was Patient's last menstrual period was 01/12/2017., presents today for a problem visit.  She complains of:  Vaginitis: Patient complains of an abnormal vaginal discharge for a few days. Vaginal symptoms include discharge described as clear.Vulvar symptoms include local irritation.STI Risk: Very low risk of STD exposureDischarge described as: clear.Other associated symptoms: none.Menstrual pattern: She had been bleeding min. Contraception: tubal ligation  PMHx: She  has a past medical history of Bacterial vaginosis; Fatty liver (2010); Hypothyroidism; Pre-diabetes (2015); Sleep apnea (2016); and Thyroid disease. Also,  has a past surgical history that includes Cholecystectomy (2010); Dilation and curettage of uterus; Hysteroscopy w/D&C (N/A, 06/16/2016); Laparoscopic tubal ligation (Bilateral, 06/16/2016); Laparoscopic ovarian cystectomy (Left, 06/16/2016); IUD removal (N/A, 06/16/2016); and Tubal ligation., family history includes Alcohol abuse in her father; Diabetes in her father and mother; Heart disease in her father; Hypertension in her mother.,  reports that she has never smoked. She has never used smokeless tobacco. She reports that she does not drink alcohol or use drugs.  She has a current medication list which includes the following prescription(s): cetirizine, metronidazole, fluconazole, hydrocodone-acetaminophen, and levothyroxine. Also, is allergic to oxycodone-acetaminophen and percocet [oxycodone-acetaminophen].  Review of Systems  Constitutional: Negative for chills, fever and malaise/fatigue.  HENT: Negative for congestion, sinus pain and sore throat.   Eyes: Negative for blurred vision and pain.  Respiratory: Negative for cough and wheezing.   Cardiovascular: Negative for chest pain and leg swelling.  Gastrointestinal: Negative for abdominal pain, constipation, diarrhea, heartburn, nausea and vomiting.  Genitourinary:  Negative for dysuria, frequency, hematuria and urgency.  Musculoskeletal: Negative for back pain, joint pain, myalgias and neck pain.  Skin: Negative for itching and rash.  Neurological: Negative for dizziness, tremors and weakness.  Endo/Heme/Allergies: Does not bruise/bleed easily.  Psychiatric/Behavioral: Negative for depression. The patient is not nervous/anxious and does not have insomnia.     Objective: BP 120/70   Pulse 88   Ht 5' (1.524 m)   Wt 160 lb (72.6 kg)   LMP 01/12/2017   BMI 31.25 kg/m  Physical Exam  Constitutional: She is oriented to person, place, and time. She appears well-developed and well-nourished. No distress.  Genitourinary: Vagina normal. Pelvic exam was performed with patient supine. There is no rash, tenderness or lesion on the right labia. There is no rash, tenderness or lesion on the left labia. No erythema or bleeding in the vagina.  Abdominal: Soft. She exhibits no distension. There is no tenderness.  Musculoskeletal: Normal range of motion.  Neurological: She is alert and oriented to person, place, and time. No cranial nerve deficit.  Skin: Skin is warm and dry.  Psychiatric: She has a normal mood and affect.    WET PREP:   positive hyphae and positive clue cells  Findings are consistent with bacterial vaginosis and C. albicans vulvovaginitis.   ASSESSMENT/PLAN:    Problem List Items Addressed This Visit      Genitourinary   BV (bacterial vaginosis) - Primary   Relevant Medications   metroNIDAZOLE (FLAGYL) 500 MG tablet   fluconazole (DIFLUCAN) 150 MG tablet    Other Visit Diagnoses    Yeast vaginitis       Relevant Medications   metroNIDAZOLE (FLAGYL) 500 MG tablet   fluconazole (DIFLUCAN) 150 MG tablet   Screening for breast cancer       Relevant Orders   MM DIGITAL SCREENING BILATERAL    Recurrent BV- treat one  pill weekly and also with period and sex as these seem to be instigators. OK for vinegar douche as desired.  Barnett Applebaum, MD, [facility], [facility] 01/18/2017  4:46 PM

## 2017-02-07 ENCOUNTER — Ambulatory Visit
Admission: RE | Admit: 2017-02-07 | Discharge: 2017-02-07 | Disposition: A | Discharge status: Home / Self Care | Payer: Commercial Managed Care - PPO | Source: Ambulatory Visit | Attending: Obstetrics & Gynecology | Admitting: Obstetrics & Gynecology

## 2017-02-07 DIAGNOSIS — Z1231 Encounter for screening mammogram for malignant neoplasm of breast: Secondary | ICD-10-CM | POA: Diagnosis present

## 2017-02-08 ENCOUNTER — Telehealth: Payer: Self-pay

## 2017-02-08 MED ORDER — BORIC ACID CRYS: 600.0000 mg | CRYSTALS | 11 refills | Status: DC

## 2017-02-08 NOTE — Telephone Encounter (Signed)
Pt calling to see if [facility] can call in something different.  She continues to have sxs - itching, uncomfortable.  Has done all she was told to do.  Does she need to be seen again or can something be called in?  [phone] (c) ; (w) [phone]

## 2017-02-08 NOTE — Telephone Encounter (Signed)
Pt aware.

## 2017-02-08 NOTE — Telephone Encounter (Signed)
Let her know--- Will try boric acid vaginally twice a week for symptoms of chronic bacterial vaginitis to help adjust pH and balance there.  Also continue preventative Flagyl antibiotic in same scenerios. Rx called in.

## 2019-06-10 ENCOUNTER — Other Ambulatory Visit: Payer: Self-pay | Admitting: Internal Medicine

## 2019-06-10 DIAGNOSIS — Z1231 Encounter for screening mammogram for malignant neoplasm of breast: Secondary | ICD-10-CM

## 2019-06-19 ENCOUNTER — Other Ambulatory Visit (HOSPITAL_COMMUNITY)
Admission: RE | Admit: 2019-06-19 | Discharge: 2019-06-19 | Disposition: A | Discharge status: Home / Self Care | Payer: Commercial Managed Care - PPO | Source: Ambulatory Visit | Attending: Obstetrics & Gynecology | Admitting: Obstetrics & Gynecology

## 2019-06-19 ENCOUNTER — Other Ambulatory Visit: Payer: Self-pay

## 2019-06-19 ENCOUNTER — Ambulatory Visit (INDEPENDENT_AMBULATORY_CARE_PROVIDER_SITE_OTHER): Payer: Commercial Managed Care - PPO | Admitting: Obstetrics & Gynecology

## 2019-06-19 ENCOUNTER — Encounter: Payer: Self-pay | Admitting: Obstetrics & Gynecology

## 2019-06-19 VITALS — BP 122/82 | Ht 60.0 in | Wt 177.0 lb

## 2019-06-19 DIAGNOSIS — Z124 Encounter for screening for malignant neoplasm of cervix: Secondary | ICD-10-CM

## 2019-06-19 DIAGNOSIS — Z01419 Encounter for gynecological examination (general) (routine) without abnormal findings: Secondary | ICD-10-CM | POA: Diagnosis not present

## 2019-06-19 DIAGNOSIS — Z1239 Encounter for other screening for malignant neoplasm of breast: Secondary | ICD-10-CM

## 2019-06-19 NOTE — Progress Notes (Signed)
HPI:      Ms. Onalee Steinmeyer is a 43 y.o. [phone] who LMP was No LMP recorded., she presents today for her annual examination. The patient has no complaints today. The patient is sexually active. Her last pap: approximate date 2016 and was normal and last mammogram: approximate date 2018 and was normal. The patient does perform self breast exams.  There is no notable family history of breast or ovarian cancer in her family.  The patient has regular exercise: yes.  The patient denies current symptoms of depression.    GYN History: Contraception: tubal ligation  S/p ablation, rare spotting No menopausal sx's  PMHx: Past Medical History:  Diagnosis Date  . Bacterial vaginosis   . Fatty liver 2010  . Hypothyroidism   . Pre-diabetes 2015  . Sleep apnea 2016   no cpap  . Thyroid disease    Past Surgical History:  Procedure Laterality Date  . ABLATION    . CHOLECYSTECTOMY  2010  . DILATION AND CURETTAGE OF UTERUS    . HYSTEROSCOPY W/D&C N/A 06/16/2016   Procedure: DILATATION AND CURETTAGE /HYSTEROSCOPY;  Surgeon: Gae Dry, MD;  Location: [facility] OR[facility];  Service: Gynecology;  Laterality: N/A;  . IUD REMOVAL N/A 06/16/2016   Procedure: INTRAUTERINE DEVICE (IUD) REMOVAL;  Surgeon: Gae Dry, MD;  Location: [facility] OR[facility];  Service: Gynecology;  Laterality: N/A;  . LAPAROSCOPIC OVARIAN CYSTECTOMY Left 06/16/2016   Procedure: LAPAROSCOPIC OVARIAN CYSTECTOMY;  Surgeon: Gae Dry, MD;  Location: [facility] OR[facility];  Service: Gynecology;  Laterality: Left;  . LAPAROSCOPIC TUBAL LIGATION Bilateral 06/16/2016   Procedure: LAPAROSCOPIC TUBAL LIGATION;  Surgeon: Gae Dry, MD;  Location: [facility] OR[facility];  Service: Gynecology;  Laterality: Bilateral;  . TUBAL LIGATION     Family History  Problem Relation Age of Onset  . Diabetes Mother   . Hypertension Mother   . Diabetes Father   . Heart disease Father   . Alcohol abuse Father    Social History   Tobacco Use  . Smoking status: Never Smoker   . Smokeless tobacco: Never Used  Substance Use Topics  . Alcohol use: No    Alcohol/week: 0.0 standard drinks  . Drug use: No    Current Outpatient Medications:  .  Boric Acid CRYS, Place 600 mg vaginally 2 (two) times a week. (Patient not taking: Reported on 06/19/2019), Disp: 500 g, Rfl: 11 .  cetirizine (ZYRTEC) 10 MG tablet, Take by mouth., Disp: , Rfl:  .  levothyroxine (SYNTHROID, LEVOTHROID) 50 MCG tablet, Take 50 mcg by mouth daily before breakfast., Disp: , Rfl:  .  metFORMIN (GLUCOPHAGE-XR) 500 MG 24 hr tablet, Take by mouth., Disp: , Rfl:  .  metroNIDAZOLE (FLAGYL) 500 MG tablet, Take 1 tablet (500 mg total) by mouth daily. For suppression; take as discussed. (Patient not taking: Reported on 06/19/2019), Disp: 30 tablet, Rfl: 4 Allergies: Oxycodone-acetaminophen and Percocet [oxycodone-acetaminophen]  Review of Systems  Constitutional: Negative for chills, fever and malaise/fatigue.  HENT: Negative for congestion, sinus pain and sore throat.   Eyes: Negative for blurred vision and pain.  Respiratory: Negative for cough and wheezing.   Cardiovascular: Negative for chest pain and leg swelling.  Gastrointestinal: Negative for abdominal pain, constipation, diarrhea, heartburn, nausea and vomiting.  Genitourinary: Negative for dysuria, frequency, hematuria and urgency.  Musculoskeletal: Negative for back pain, joint pain, myalgias and neck pain.  Skin: Negative for itching and rash.  Neurological: Negative for dizziness, tremors and weakness.  Endo/Heme/Allergies: Does not bruise/bleed easily.  Psychiatric/Behavioral: Negative for depression. The patient is not nervous/anxious and does not have insomnia.     Objective: BP 122/82   Ht 5' (1.524 m)   Wt 177 lb (80.3 kg)   BMI 34.57 kg/m   Filed Weights   06/19/19 1518  Weight: 177 lb (80.3 kg)   Body mass index is 34.57 kg/m. Physical Exam Constitutional:      General: She is not in acute distress.    Appearance: She is  well-developed.  Genitourinary:     Pelvic exam was performed with patient supine.     Vagina, uterus and rectum normal.     No lesions in the vagina.     No vaginal bleeding.     No cervical motion tenderness, friability, lesion or polyp.     Uterus is mobile.     Uterus is not enlarged.     No uterine mass detected.    Uterus is midaxial.     No right or left adnexal mass present.     Right adnexa not tender.     Left adnexa not tender.  HENT:     Head: Normocephalic and atraumatic. No laceration.     Right Ear: Hearing normal.     Left Ear: Hearing normal.     Mouth/Throat:     Pharynx: Uvula midline.  Eyes:     Pupils: Pupils are equal, round, and reactive to light.  Neck:     Musculoskeletal: Normal range of motion and neck supple.     Thyroid: No thyromegaly.  Cardiovascular:     Rate and Rhythm: Normal rate and regular rhythm.     Heart sounds: No murmur. No friction rub. No gallop.   Pulmonary:     Effort: Pulmonary effort is normal. No respiratory distress.     Breath sounds: Normal breath sounds. No wheezing.  Chest:     Breasts:        Right: No mass, skin change or tenderness.        Left: No mass, skin change or tenderness.  Abdominal:     General: Bowel sounds are normal. There is no distension.     Palpations: Abdomen is soft.     Tenderness: There is no abdominal tenderness. There is no rebound.  Musculoskeletal: Normal range of motion.  Neurological:     Mental Status: She is alert and oriented to person, place, and time.     Cranial Nerves: No cranial nerve deficit.  Skin:    General: Skin is warm and dry.  Psychiatric:        Judgment: Judgment normal.  Vitals signs reviewed.     Assessment:  ANNUAL EXAM 1. Women's annual routine gynecological examination   2. Screening for cervical cancer   3. Screening for breast cancer      Screening Plan:            1.  Cervical Screening-  Pap smear done today  2. Breast screening- Exam annually  and mammogram>40 planned   3. Colonoscopy every 10 years, Hemoccult testing - after age 52  4. Labs managed by PCP  5. Counseling for contraception: bilateral tubal ligation     F/U  Return in about 1 year (around 06/18/2020) for Annual.  Barnett Applebaum, MD, [facility], [facility] 06/19/2019  3:41 PM

## 2019-06-19 NOTE — Patient Instructions (Signed)
PAP every three years Mammogram every year    Call [phone] to schedule at [facility] yearly (with PCP)   

## 2019-06-21 LAB — CYTOLOGY - PAP
Diagnosis: NEGATIVE
HPV: DETECTED — AB

## 2019-07-18 ENCOUNTER — Ambulatory Visit
Admission: RE | Admit: 2019-07-18 | Discharge: 2019-07-18 | Disposition: A | Discharge status: Home / Self Care | Payer: Commercial Managed Care - PPO | Source: Ambulatory Visit | Attending: Internal Medicine | Admitting: Internal Medicine

## 2019-07-18 DIAGNOSIS — Z1231 Encounter for screening mammogram for malignant neoplasm of breast: Secondary | ICD-10-CM | POA: Insufficient documentation

## 2019-08-07 ENCOUNTER — Encounter: Payer: Self-pay | Admitting: Obstetrics & Gynecology

## 2019-08-09 ENCOUNTER — Ambulatory Visit: Payer: Commercial Managed Care - PPO

## 2019-08-23 ENCOUNTER — Other Ambulatory Visit: Payer: Self-pay | Admitting: *Deleted

## 2019-08-23 DIAGNOSIS — Z20822 Contact with and (suspected) exposure to covid-19: Secondary | ICD-10-CM

## 2019-08-24 LAB — NOVEL CORONAVIRUS, NAA: SARS-CoV-2, NAA: NOT DETECTED

## 2019-08-30 ENCOUNTER — Other Ambulatory Visit: Payer: Self-pay | Admitting: *Deleted

## 2019-08-30 DIAGNOSIS — Z20822 Contact with and (suspected) exposure to covid-19: Secondary | ICD-10-CM

## 2019-09-01 LAB — NOVEL CORONAVIRUS, NAA: SARS-CoV-2, NAA: NOT DETECTED

## 2020-04-29 ENCOUNTER — Telehealth: Payer: Self-pay | Admitting: Licensed Clinical Social Worker

## 2020-04-29 NOTE — Telephone Encounter (Signed)
attempted to call patient to return vm requesting to reengage in services. LCSW left vm.

## 2020-08-06 ENCOUNTER — Other Ambulatory Visit: Payer: Commercial Managed Care - PPO

## 2020-08-06 DIAGNOSIS — Z20822 Contact with and (suspected) exposure to covid-19: Secondary | ICD-10-CM

## 2020-08-07 LAB — NOVEL CORONAVIRUS, NAA: SARS-CoV-2, NAA: NOT DETECTED

## 2020-08-07 LAB — SARS-COV-2, NAA 2 DAY TAT

## 2020-12-04 ENCOUNTER — Ambulatory Visit (INDEPENDENT_AMBULATORY_CARE_PROVIDER_SITE_OTHER): Payer: Commercial Managed Care - PPO | Admitting: Obstetrics & Gynecology

## 2020-12-04 ENCOUNTER — Other Ambulatory Visit: Payer: Self-pay

## 2020-12-04 ENCOUNTER — Other Ambulatory Visit (HOSPITAL_COMMUNITY)
Admission: RE | Admit: 2020-12-04 | Discharge: 2020-12-04 | Disposition: A | Discharge status: Home / Self Care | Payer: Commercial Managed Care - PPO | Source: Ambulatory Visit | Attending: Obstetrics & Gynecology | Admitting: Obstetrics & Gynecology

## 2020-12-04 ENCOUNTER — Encounter: Payer: Self-pay | Admitting: Obstetrics & Gynecology

## 2020-12-04 VITALS — BP 120/80 | Ht 60.0 in | Wt 185.0 lb

## 2020-12-04 DIAGNOSIS — Z1231 Encounter for screening mammogram for malignant neoplasm of breast: Secondary | ICD-10-CM

## 2020-12-04 DIAGNOSIS — Z124 Encounter for screening for malignant neoplasm of cervix: Secondary | ICD-10-CM | POA: Insufficient documentation

## 2020-12-04 DIAGNOSIS — Z01419 Encounter for gynecological examination (general) (routine) without abnormal findings: Secondary | ICD-10-CM | POA: Diagnosis not present

## 2020-12-04 NOTE — Patient Instructions (Signed)
PAP every year Mammogram every year    Call [phone] to schedule at [facility] yearly (with PCP)  Thank you for choosing [facility]. As part of our ongoing efforts to improve patient experience, we would appreciate your feedback. Please fill out the short survey that you will receive by mail or MyChart. Your opinion is important to [place]! - Dr. Kenton Kingfisher  Recommendations to boost your immunity to prevent illness such as viral flu and colds, including covid19, are as follows:       - - -  Vitamin K2 and Vitamin D3  - - - Take Vitamin K2 at 200-300 mcg daily (usually 2-3 pills daily of the over the counter formulation). Take Vitamin D3 at 3000-4000 U daily (usually 3-4 pills daily of the over the counter formulation). Studies show that these two at high normal levels in your system are very effective in keeping your immunity so strong and protective that you will be unlikely to contract viral illness such as those listed above.  Dr Kenton Kingfisher

## 2020-12-04 NOTE — Progress Notes (Signed)
HPI:      Latoya Logan is a 45 y.o. [phone] who LMP was No LMP recorded. Patient has had an ablation., she presents today for her annual examination. The patient has no complaints today. The patient is sexually active. Her last pap: approximate date 2020 and was normal except for POS HPV; and last mammogram: approximate date 2020 and was normal. The patient does perform self breast exams.  There is no notable family history of breast or ovarian cancer in her family.  The patient has regular exercise: yes.  The patient reports current symptoms of depression as she last her 45 year old last fall to overdose.    GYN History: Contraception: tubal ligation  Scant bleeding noted, s/p ablation  PMHx: Past Medical History:  Diagnosis Date  . Bacterial vaginosis   . Fatty liver 2010  . Hypothyroidism   . Pre-diabetes 2015  . Sleep apnea 2016   no cpap  . Thyroid disease    Past Surgical History:  Procedure Laterality Date  . ABLATION    . CHOLECYSTECTOMY  2010  . DILATION AND CURETTAGE OF UTERUS    . HYSTEROSCOPY WITH D & C N/A 06/16/2016   Procedure: DILATATION AND CURETTAGE /HYSTEROSCOPY;  Surgeon: Gae Dry, MD;  Location: [facility];  Service: Gynecology;  Laterality: N/A;  . IUD REMOVAL N/A 06/16/2016   Procedure: INTRAUTERINE DEVICE (IUD) REMOVAL;  Surgeon: Gae Dry, MD;  Location: [facility];  Service: Gynecology;  Laterality: N/A;  . LAPAROSCOPIC OVARIAN CYSTECTOMY Left 06/16/2016   Procedure: LAPAROSCOPIC OVARIAN CYSTECTOMY;  Surgeon: Gae Dry, MD;  Location: [facility];  Service: Gynecology;  Laterality: Left;  . LAPAROSCOPIC TUBAL LIGATION Bilateral 06/16/2016   Procedure: LAPAROSCOPIC TUBAL LIGATION;  Surgeon: Gae Dry, MD;  Location: [facility];  Service: Gynecology;  Laterality: Bilateral;  . TUBAL LIGATION     Family History  Problem Relation Age of Onset  . Diabetes Mother   . Hypertension Mother   . Diabetes Father   . Heart disease Father   .  Alcohol abuse Father    Social History   Tobacco Use  . Smoking status: Never Smoker  . Smokeless tobacco: Never Used  Substance Use Topics  . Alcohol use: No    Alcohol/week: 0.0 standard drinks  . Drug use: No    Current Outpatient Medications:  .  cetirizine (ZYRTEC) 10 MG tablet, Take by mouth., Disp: , Rfl:  .  escitalopram (LEXAPRO) 10 MG tablet, Take by mouth., Disp: , Rfl:  .  Boric Acid CRYS, Place 600 mg vaginally 2 (two) times a week. (Patient not taking: Reported on 12/04/2020), Disp: 500 g, Rfl: 11 .  cetirizine (ZYRTEC) 10 MG tablet, Take by mouth. (Patient not taking: Reported on 12/04/2020), Disp: , Rfl:  .  levothyroxine (SYNTHROID, LEVOTHROID) 50 MCG tablet, Take 50 mcg by mouth daily before breakfast., Disp: , Rfl:  .  metFORMIN (GLUCOPHAGE-XR) 500 MG 24 hr tablet, Take by mouth., Disp: , Rfl:  .  metroNIDAZOLE (FLAGYL) 500 MG tablet, Take 1 tablet (500 mg total) by mouth daily. For suppression; take as discussed. (Patient not taking: Reported on 06/19/2019), Disp: 30 tablet, Rfl: 4 Allergies: Oxycodone-acetaminophen and Percocet [oxycodone-acetaminophen]  Review of Systems  Constitutional: Negative for chills, fever and malaise/fatigue.  HENT: Negative for congestion, sinus pain and sore throat.   Eyes: Negative for blurred vision and pain.  Respiratory: Negative for cough and wheezing.   Cardiovascular: Negative for chest pain and leg  swelling.  Gastrointestinal: Negative for abdominal pain, constipation, diarrhea, heartburn, nausea and vomiting.  Genitourinary: Negative for dysuria, frequency, hematuria and urgency.  Musculoskeletal: Negative for back pain, joint pain, myalgias and neck pain.  Skin: Negative for itching and rash.  Neurological: Negative for dizziness, tremors and weakness.  Endo/Heme/Allergies: Does not bruise/bleed easily.  Psychiatric/Behavioral: Positive for depression. The patient is not nervous/anxious and does not have insomnia.      Objective: BP 120/80   Ht 5' (1.524 m)   Wt 185 lb (83.9 kg)   BMI 36.13 kg/m   Filed Weights   12/04/20 1430  Weight: 185 lb (83.9 kg)   Body mass index is 36.13 kg/m. Physical Exam Constitutional:      General: She is not in acute distress.    Appearance: She is well-developed and well-nourished.  Genitourinary:     Vagina, uterus and rectum normal.     There is no rash or lesion on the right labia.     There is no rash or lesion on the left labia.    No lesions in the vagina.     No vaginal bleeding.      Right Adnexa: not tender and no mass present.    Left Adnexa: not tender and no mass present.    No cervical motion tenderness, friability, lesion or polyp.     Uterus is mobile.     Uterus is not enlarged.     No uterine mass detected.    Uterus is midaxial.     Pelvic exam was performed with patient supine.  Breasts:     Right: No mass, skin change or tenderness.     Left: No mass, skin change or tenderness.    HENT:     Head: Normocephalic and atraumatic. No laceration.     Right Ear: Hearing normal.     Left Ear: Hearing normal.     Nose: No epistaxis or foreign body.     Mouth/Throat:     Mouth: Oropharynx is clear and moist and mucous membranes are normal.     Pharynx: Uvula midline.  Eyes:     Pupils: Pupils are equal, round, and reactive to light.  Neck:     Thyroid: No thyromegaly.  Cardiovascular:     Rate and Rhythm: Normal rate and regular rhythm.     Heart sounds: No murmur heard. No friction rub. No gallop.   Pulmonary:     Effort: Pulmonary effort is normal. No respiratory distress.     Breath sounds: Normal breath sounds. No wheezing.  Abdominal:     General: Bowel sounds are normal. There is no distension.     Palpations: Abdomen is soft.     Tenderness: There is no abdominal tenderness. There is no rebound.  Musculoskeletal:        General: Normal range of motion.     Cervical back: Normal range of motion and neck supple.   Neurological:     Mental Status: She is alert and oriented to person, place, and time.     Cranial Nerves: No cranial nerve deficit.  Skin:    General: Skin is warm and dry.  Psychiatric:        Mood and Affect: Mood and affect normal.        Judgment: Judgment normal.  Vitals reviewed.     Assessment:  ANNUAL EXAM 1. Women's annual routine gynecological examination   2. Screening for cervical cancer   3. Encounter for screening  mammogram for malignant neoplasm of breast      Screening Plan:            1.  Cervical Screening-  Pap smear done today  2. Breast screening- Exam annually and mammogram>40 planned   3. Colonoscopy every 10 years, Hemoccult testing - after age 27  4. Labs managed by PCP  5. Counseling for contraception: bilateral tubal ligation     F/U  Return in about 1 year (around 12/04/2021) for Annual.  Barnett Applebaum, MD, [facility], [facility] 12/04/2020  3:12 PM

## 2020-12-09 LAB — CYTOLOGY - PAP
Adequacy: ABSENT
Comment: NEGATIVE
Diagnosis: NEGATIVE
High risk HPV: POSITIVE — AB

## 2021-05-27 ENCOUNTER — Other Ambulatory Visit: Payer: Self-pay | Admitting: Physician Assistant

## 2021-05-27 ENCOUNTER — Other Ambulatory Visit (HOSPITAL_COMMUNITY): Payer: Self-pay | Admitting: Physician Assistant

## 2021-05-27 DIAGNOSIS — R1011 Right upper quadrant pain: Secondary | ICD-10-CM

## 2021-05-27 DIAGNOSIS — R7989 Other specified abnormal findings of blood chemistry: Secondary | ICD-10-CM

## 2021-08-25 ENCOUNTER — Ambulatory Visit (INDEPENDENT_AMBULATORY_CARE_PROVIDER_SITE_OTHER): Payer: Commercial Managed Care - PPO | Admitting: Obstetrics and Gynecology

## 2021-08-25 ENCOUNTER — Encounter: Payer: Self-pay | Admitting: Obstetrics and Gynecology

## 2021-08-25 ENCOUNTER — Other Ambulatory Visit: Payer: Self-pay

## 2021-08-25 VITALS — BP 106/70 | Ht 60.0 in | Wt 198.0 lb

## 2021-08-25 DIAGNOSIS — N750 Cyst of Bartholin's gland: Secondary | ICD-10-CM

## 2021-08-25 DIAGNOSIS — N764 Abscess of vulva: Secondary | ICD-10-CM | POA: Insufficient documentation

## 2021-08-25 MED ORDER — DOXYCYCLINE HYCLATE 100 MG PO CAPS
100.0000 mg | ORAL_CAPSULE | Freq: Two times a day (BID) | ORAL | 0 refills | Status: AC
Start: 2021-08-25 — End: 2021-09-08

## 2021-08-25 NOTE — Progress Notes (Signed)
[name], MD   Chief Complaint  Patient presents with   Vaginal Exam    Abcess on labia since the weekend    HPI:      [name] is a 45 y.o. [phone] whose LMP was No LMP recorded. Patient has had an ablation., presents today for RT labial abscess for several days, no d/c. Very painful and swollen, no fevers. Hasn't tried warm compresses/sitz baths of NSAIDs. Never had this before. No hx of Bartholins gland abscess.    Past Medical History:  Diagnosis Date   Bacterial vaginosis    Fatty liver 2010   Hypothyroidism    Pre-diabetes 2015   Sleep apnea 2016   no cpap   Thyroid disease     Past Surgical History:  Procedure Laterality Date   ABLATION     CHOLECYSTECTOMY  2010   DILATION AND CURETTAGE OF UTERUS     HYSTEROSCOPY WITH D & C N/A 06/16/2016   Procedure: DILATATION AND CURETTAGE /HYSTEROSCOPY;  Surgeon: Gae Dry, MD;  Location: [facility];  Service: Gynecology;  Laterality: N/A;   IUD REMOVAL N/A 06/16/2016   Procedure: INTRAUTERINE DEVICE (IUD) REMOVAL;  Surgeon: Gae Dry, MD;  Location: [facility];  Service: Gynecology;  Laterality: N/A;   LAPAROSCOPIC OVARIAN CYSTECTOMY Left 06/16/2016   Procedure: LAPAROSCOPIC OVARIAN CYSTECTOMY;  Surgeon: Gae Dry, MD;  Location: [facility];  Service: Gynecology;  Laterality: Left;   LAPAROSCOPIC TUBAL LIGATION Bilateral 06/16/2016   Procedure: LAPAROSCOPIC TUBAL LIGATION;  Surgeon: Gae Dry, MD;  Location: [facility];  Service: Gynecology;  Laterality: Bilateral;   TUBAL LIGATION      Family History  Problem Relation Age of Onset   Diabetes Mother    Hypertension Mother    Diabetes Father    Heart disease Father    Alcohol abuse Father     Social History   Socioeconomic History   Marital status: Soil scientist    Spouse name: Not on file   Number of children: Not on file   Years of education: Not on file   Highest education level: Not on file  Occupational History   Not  on file  Tobacco Use   Smoking status: Never   Smokeless tobacco: Never  Vaping Use   Vaping Use: Never used  Substance and Sexual Activity   Alcohol use: No    Alcohol/week: 0.0 standard drinks   Drug use: No   Sexual activity: Yes    Birth control/protection: Surgical    Comment: tubal  Other Topics Concern   Not on file  Social History Narrative   Not on file   Social Determinants of Health   Financial Resource Strain: Not on file  Food Insecurity: Not on file  Transportation Needs: Not on file  Physical Activity: Not on file  Stress: Not on file  Social Connections: Not on file  Intimate Partner Violence: Not on file    Outpatient Medications Prior to Visit  Medication Sig Dispense Refill   busPIRone (BUSPAR) 7.5 MG tablet Take 7.5 mg by mouth 2 (two) times daily.     cetirizine (ZYRTEC) 10 MG tablet Take by mouth.     escitalopram (LEXAPRO) 20 MG tablet Take 20 mg by mouth daily.     fluticasone (FLONASE) 50 MCG/ACT nasal spray Place into the nose.     levothyroxine (SYNTHROID) 100 MCG tablet Take 100 mcg by mouth daily.     traZODone (DESYREL) 50 MG  tablet Take 50 mg by mouth at bedtime.     tretinoin (RETIN-A) 0.05 % cream Apply topically.     Semaglutide,0.25 or 0.5MG /DOS, 2 MG/1.5ML SOPN Inject into the skin. (Patient not taking: Reported on 08/25/2021)     Boric Acid CRYS Place 600 mg vaginally 2 (two) times a week. (Patient not taking: Reported on 12/04/2020) 500 g 11   escitalopram (LEXAPRO) 10 MG tablet Take by mouth.     No facility-administered medications prior to visit.      ROS:  Review of Systems  Constitutional:  Negative for fever.  Gastrointestinal:  Negative for blood in stool, constipation, diarrhea, nausea and vomiting.  Genitourinary:  Positive for genital sores. Negative for dyspareunia, dysuria, flank pain, frequency, hematuria, urgency, vaginal bleeding, vaginal discharge and vaginal pain.  Musculoskeletal:  Negative for back pain.   Skin:  Negative for rash.  BREAST: No symptoms   OBJECTIVE:   Vitals:  BP 106/70   Ht 5' (1.524 m)   Wt 198 lb (89.8 kg)   BMI 38.67 kg/m   Physical Exam Vitals reviewed.  Constitutional:      Appearance: She is well-developed.  Pulmonary:     Effort: Pulmonary effort is normal.  Genitourinary:    Labia:        Right: Tenderness and lesion present. No rash.        Left: No rash, tenderness or lesion.     Musculoskeletal:        General: Normal range of motion.     Cervical back: Normal range of motion.  Skin:    General: Skin is warm and dry.  Neurological:     General: No focal deficit present.     Mental Status: She is alert and oriented to person, place, and time.     Cranial Nerves: No cranial nerve deficit.  Psychiatric:        Mood and Affect: Mood normal.        Behavior: Behavior normal.        Thought Content: Thought content normal.        Judgment: Judgment normal.    Assessment/Plan: Labial abscess - Plan: doxycycline (VIBRAMYCIN) 100 MG capsule; RT labia. No place to I&D. Start doxy and sitz baths/warm compresses/NSAIDs. F/u with Dr. Kenton Kingfisher if sx worsen.   Bartholin's cyst--question cyst (no evid of abscess) vs generalized swelling of area. Doing warm compresses/abx anyway. F/u if sx persist/worsen.    Meds ordered this encounter  Medications   doxycycline (VIBRAMYCIN) 100 MG capsule    Sig: Take 1 capsule (100 mg total) by mouth 2 (two) times daily for 14 days.    Dispense:  28 capsule    Refill:  0    Order Specific Question:   Supervising Provider    Answer:   Gae Dry [[id number]]       Return if symptoms worsen or fail to improve.  Candela Krul B. Ching Rabideau, PA-C 08/25/2021 9:53 AM

## 2021-09-09 ENCOUNTER — Encounter: Payer: Self-pay | Admitting: Obstetrics & Gynecology

## 2021-10-31 ENCOUNTER — Other Ambulatory Visit: Payer: Self-pay

## 2021-10-31 ENCOUNTER — Encounter (HOSPITAL_COMMUNITY): Payer: Self-pay | Admitting: *Deleted

## 2021-10-31 ENCOUNTER — Ambulatory Visit (HOSPITAL_COMMUNITY)
Admission: EM | Admit: 2021-10-31 | Discharge: 2021-10-31 | Disposition: A | Discharge status: Home / Self Care | Payer: Commercial Managed Care - PPO

## 2021-10-31 DIAGNOSIS — J069 Acute upper respiratory infection, unspecified: Secondary | ICD-10-CM | POA: Diagnosis not present

## 2021-10-31 HISTORY — DX: Depression, unspecified: F32.A

## 2021-10-31 MED ORDER — PROMETHAZINE-DM 6.25-15 MG/5ML PO SYRP: 5.0000 mL | ORAL_SOLUTION | Freq: Four times a day (QID) | ORAL | 0 refills | Status: DC | PRN

## 2021-10-31 NOTE — ED Triage Notes (Signed)
C/O sore throat with "green tinged with blood" postnasal drainage and nasal discharge, facial pressure. Woke this AM with hoarse voice. Unk if fevers. Taking AlkaSeltzer Cold. Pt declining Covid test at this time.

## 2021-10-31 NOTE — Discharge Instructions (Addendum)
Take cough syrup as needed for cough Recommend daily Zyrtec or Claritin, Flonase, and Mucinex Drink plenty of fluids, rest  Follow up with primary care if no improvement

## 2021-10-31 NOTE — ED Provider Notes (Signed)
[place]    CSN: [id number] Arrival date & time: 10/31/21  1040      History   Chief Complaint Chief Complaint  Patient presents with   Sore Throat    HPI Latoya Logan is a 46 y.o. female.   Pt complains sore throat, congestion, postnasal drip and cough that started three days ago.  Denies fever, chills, shortness of breath, body aches.  She has had her flu shot and COVID vaccine.  She has taken AlkaSeltzer cold with some improvement.     Past Medical History:  Diagnosis Date   Bacterial vaginosis    Depression    Fatty liver 2010   Hypothyroidism    Pre-diabetes 2015   Sleep apnea 2016   no cpap   Thyroid disease     Patient Active Problem List   Diagnosis Date Noted   Labial abscess 08/25/2021   BV (bacterial vaginosis) 01/18/2017   Menometrorrhagia 06/16/2016    Past Surgical History:  Procedure Laterality Date   ABLATION     CHOLECYSTECTOMY  2010   DILATION AND CURETTAGE OF UTERUS     HYSTEROSCOPY WITH D & C N/A 06/16/2016   Procedure: DILATATION AND CURETTAGE /HYSTEROSCOPY;  Surgeon: Gae Dry, MD;  Location: [facility];  Service: Gynecology;  Laterality: N/A;   IUD REMOVAL N/A 06/16/2016   Procedure: INTRAUTERINE DEVICE (IUD) REMOVAL;  Surgeon: Gae Dry, MD;  Location: [facility];  Service: Gynecology;  Laterality: N/A;   LAPAROSCOPIC OVARIAN CYSTECTOMY Left 06/16/2016   Procedure: LAPAROSCOPIC OVARIAN CYSTECTOMY;  Surgeon: Gae Dry, MD;  Location: [facility];  Service: Gynecology;  Laterality: Left;   LAPAROSCOPIC TUBAL LIGATION Bilateral 06/16/2016   Procedure: LAPAROSCOPIC TUBAL LIGATION;  Surgeon: Gae Dry, MD;  Location: [facility];  Service: Gynecology;  Laterality: Bilateral;   TUBAL LIGATION      OB History     Gravida  4   Para      Term      Preterm      AB  1   Living  3      SAB  1   IAB      Ectopic      Multiple      Live Births               Home  Medications    Prior to Admission medications   Medication Sig Start Date End Date Taking? Authorizing Provider  cetirizine (ZYRTEC) 10 MG tablet Take by mouth. 05/13/16  Yes [provider]  escitalopram (LEXAPRO) 20 MG tablet Take 20 mg by mouth daily. 08/23/21  Yes [provider]  levothyroxine (SYNTHROID) 100 MCG tablet Take 100 mcg by mouth daily. 08/23/21  Yes [provider]  promethazine-dextromethorphan (PROMETHAZINE-DM) 6.25-15 MG/5ML syrup Take 5 mLs by mouth 4 (four) times daily as needed for cough. 10/31/21  Yes Ward, Lenise Arena, PA-C  Semaglutide (OZEMPIC, 0.25 OR 0.5 MG/DOSE, [place]) Inject into the skin.   Yes [provider]  Semaglutide,0.25 or 0.5MG /DOS, 2 MG/1.5ML SOPN Inject into the skin. 08/09/21 08/09/22 Yes [provider]  busPIRone (BUSPAR) 7.5 MG tablet Take 7.5 mg by mouth 2 (two) times daily. 08/23/21   [provider]  fluticasone Asencion Islam) 50 MCG/ACT nasal spray Place into the nose. 05/13/16   [provider]  traZODone (DESYREL) 50 MG tablet Take 50 mg by mouth at bedtime. 06/06/21   [provider]  tretinoin (RETIN-A) 0.05 % cream Apply topically. 04/13/21  [provider]    Family History Family History  Problem Relation Age of Onset   Diabetes Mother    Hypertension Mother    Diabetes Father    Heart disease Father    Alcohol abuse Father     Social History Social History   Tobacco Use   Smoking status: Never   Smokeless tobacco: Never  Vaping Use   Vaping Use: Never used  Substance Use Topics   Alcohol use: Yes    Comment: occasionally   Drug use: No     Allergies   Oxycodone-acetaminophen and Percocet [oxycodone-acetaminophen]   Review of Systems Review of Systems  Constitutional:  Negative for chills and fever.  HENT:  Positive for congestion, postnasal drip and sore throat. Negative for ear pain.   Eyes:  Negative for pain and visual disturbance.   Respiratory:  Positive for cough. Negative for shortness of breath.   Cardiovascular:  Negative for chest pain and palpitations.  Gastrointestinal:  Negative for abdominal pain and vomiting.  Genitourinary:  Negative for dysuria and hematuria.  Musculoskeletal:  Negative for arthralgias and back pain.  Skin:  Negative for color change and rash.  Neurological:  Negative for seizures and syncope.  All other systems reviewed and are negative.   Physical Exam Triage Vital Signs ED Triage Vitals  Enc Vitals Group     BP 10/31/21 1125 122/82     Pulse Rate 10/31/21 1125 80     Resp 10/31/21 1125 18     Temp 10/31/21 1125 98.1 F (36.7 C)     Temp Source 10/31/21 1125 Oral     SpO2 10/31/21 1125 98 %     Weight --      Height --      Head Circumference --      Peak Flow --      Pain Score 10/31/21 1127 3     Pain Loc --      Pain Edu? --      Excl. in [place]? --    No data found.  Updated Vital Signs BP 122/82    Pulse 80    Temp 98.1 F (36.7 C) (Oral)    Resp 18    SpO2 98%   Visual Acuity Right Eye Distance:   Left Eye Distance:   Bilateral Distance:    Right Eye Near:   Left Eye Near:    Bilateral Near:     Physical Exam Vitals and nursing note reviewed.  Constitutional:      General: She is not in acute distress.    Appearance: She is well-developed.  HENT:     Head: Normocephalic and atraumatic.  Eyes:     Conjunctiva/sclera: Conjunctivae normal.  Cardiovascular:     Rate and Rhythm: Normal rate and regular rhythm.     Heart sounds: No murmur heard. Pulmonary:     Effort: Pulmonary effort is normal. No respiratory distress.     Breath sounds: Normal breath sounds.  Abdominal:     Palpations: Abdomen is soft.     Tenderness: There is no abdominal tenderness.  Musculoskeletal:        General: No swelling.     Cervical back: Neck supple.  Skin:    General: Skin is warm and dry.     Capillary Refill: Capillary refill takes less than 2 seconds.   Neurological:     Mental Status: She is alert.  Psychiatric:        Mood and Affect:  Mood normal.     UC Treatments / Results  Labs (all labs ordered are listed, but only abnormal results are displayed) Labs Reviewed - No data to display  EKG   Radiology No results found.  Procedures Procedures (including critical care time)  Medications Ordered in UC Medications - No data to display  Initial Impression / Assessment and Plan / UC Course  I have reviewed the triage vital signs and the nursing notes.  Pertinent labs & imaging results that were available during my care of the patient were reviewed by me and considered in my medical decision making (see chart for details).     URI, declines COVID test, will use home COVID test when she gets home.  Discussed isolation precautions if positive home test.  Advised continued supportive care, cough syrup prescribed.  Return precautions disucssed.  Final Clinical Impressions(s) / UC Diagnoses   Final diagnoses:  Viral upper respiratory tract infection     Discharge Instructions      Take cough syrup as needed for cough Recommend daily Zyrtec or Claritin, Flonase, and Mucinex Drink plenty of fluids, rest  Follow up with primary care if no improvement    ED Prescriptions     Medication Sig Dispense Auth. Provider   promethazine-dextromethorphan (PROMETHAZINE-DM) 6.25-15 MG/5ML syrup Take 5 mLs by mouth 4 (four) times daily as needed for cough. 118 mL Ward, Lenise Arena, PA-C      PDMP not reviewed this encounter.   Ward, Lenise Arena, PA-C 10/31/21 1147

## 2022-03-14 ENCOUNTER — Encounter: Payer: Self-pay | Admitting: Obstetrics

## 2022-03-14 ENCOUNTER — Ambulatory Visit (INDEPENDENT_AMBULATORY_CARE_PROVIDER_SITE_OTHER): Payer: Commercial Managed Care - PPO | Admitting: Obstetrics

## 2022-03-14 VITALS — BP 122/70 | Ht 60.0 in | Wt 183.0 lb

## 2022-03-14 DIAGNOSIS — N9489 Other specified conditions associated with female genital organs and menstrual cycle: Secondary | ICD-10-CM

## 2022-03-14 DIAGNOSIS — R7989 Other specified abnormal findings of blood chemistry: Secondary | ICD-10-CM

## 2022-03-14 DIAGNOSIS — R7303 Prediabetes: Secondary | ICD-10-CM

## 2022-03-14 DIAGNOSIS — Z9889 Other specified postprocedural states: Secondary | ICD-10-CM | POA: Diagnosis not present

## 2022-03-14 DIAGNOSIS — R399 Unspecified symptoms and signs involving the genitourinary system: Secondary | ICD-10-CM

## 2022-03-14 DIAGNOSIS — N949 Unspecified condition associated with female genital organs and menstrual cycle: Secondary | ICD-10-CM

## 2022-03-14 MED ORDER — PHENAZOPYRIDINE HCL 200 MG PO TABS
200.0000 mg | ORAL_TABLET | Freq: Three times a day (TID) | ORAL | 0 refills | Status: AC | PRN
Start: 2022-03-14 — End: 2022-03-16

## 2022-03-14 MED ORDER — FLUCONAZOLE 150 MG PO TABS: 150.0000 mg | ORAL_TABLET | Freq: Once | ORAL | 0 refills | Status: AC

## 2022-03-14 MED ORDER — NITROFURANTOIN MONOHYD MACRO 100 MG PO CAPS: 100.0000 mg | ORAL_CAPSULE | Freq: Two times a day (BID) | ORAL | 0 refills | Status: AC

## 2022-03-14 NOTE — Addendum Note (Signed)
Addended by: Brien Few on: 03/14/2022 04:02 PM   Modules accepted: Orders

## 2022-03-14 NOTE — Patient Instructions (Signed)
Have a great year! Please call with any concerns. Don't forget to wear your seatbelt everyday! If you are not signed up on MyChart, please ask [place] how to sign up for it!   In a world where you can be anything, please be kind.   There is no height or weight on file to calculate BMI.  A Healthy Lifestyle: Care Instructions Your Care Instructions  A healthy lifestyle can help you feel good, stay at a healthy weight, and have plenty of energy for both work and play. A healthy lifestyle is something you can share with your whole family. A healthy lifestyle also can lower your risk for serious health problems, such as high blood pressure, heart disease, and diabetes. You can follow a few steps listed below to improve your health and the health of your family. Follow-up care is a key part of your treatment and safety. Be sure to make and go to all appointments, and call your doctor if you are having problems. It's also a good idea to know your test results and keep a list of the medicines you take. How can you care for yourself at home? Do not eat too much sugar, fat, or fast foods. You can still have dessert and treats now and then. The goal is moderation. Start small to improve your eating habits. Pay attention to portion sizes, drink less juice and soda pop, and eat more fruits and vegetables. Eat a healthy amount of food. A 3-ounce serving of meat, for example, is about the size of a deck of cards. Fill the rest of your plate with vegetables and whole grains. Limit the amount of soda and sports drinks you have every day. Drink more water when you are thirsty. Eat at least 5 servings of fruits and vegetables every day. It may seem like a lot, but it is not hard to reach this goal. A serving or helping is 1 piece of fruit, 1 cup of vegetables, or 2 cups of leafy, raw vegetables. Have an apple or some carrot sticks as an afternoon snack instead of a candy bar. Try to have fruits and/or vegetables at  every meal. Make exercise part of your daily routine. You may want to start with simple activities, such as walking, bicycling, or slow swimming. Try to be active 30 to 60 minutes every day. You do not need to do all 30 to 60 minutes all at once. For example, you can exercise 3 times a day for 10 or 20 minutes. Moderate exercise is safe for most people, but it is always a good idea to talk to your doctor before starting an exercise program. Keep moving. Mow the lawn, work in the garden, or [employer]. Take the stairs instead of the elevator at work. If you smoke, quit. People who smoke have an increased risk for heart attack, stroke, cancer, and other lung illnesses. Quitting is hard, but there are ways to boost your chance of quitting tobacco for good. Use nicotine gum, patches, or lozenges. Ask your doctor about stop-smoking programs and medicines. Keep trying. In addition to reducing your risk of diseases in the future, you will notice some benefits soon after you stop using tobacco. If you have shortness of breath or asthma symptoms, they will likely get better within a few weeks after you quit. Limit how much alcohol you drink. Moderate amounts of alcohol (up to 2 drinks a day for men, 1 drink a day for women) are okay. But drinking  too much can lead to liver problems, high blood pressure, and other health problems. Family health If you have a family, there are many things you can do together to improve your health. Eat meals together as a family as often as possible. Eat healthy foods. This includes fruits, vegetables, lean meats and dairy, and whole grains. Include your family in your fitness plan. Most people think of activities such as jogging or tennis as the way to fitness, but there are many ways you and your family can be more active. Anything that makes you breathe hard and gets your heart pumping is exercise. Here are some tips: Walk to do errands or to take your child to school or  the bus. Go for a family bike ride after dinner instead of watching TV. Care instructions adapted under license by your healthcare professional. This care instruction is for use with your licensed healthcare professional. If you have questions about a medical condition or this instruction, always ask your healthcare professional. [place] any warranty or liability for your use of this information.
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
## 2022-03-17 LAB — URINE CULTURE

## 2022-03-18 ENCOUNTER — Other Ambulatory Visit: Payer: Self-pay

## 2022-03-18 LAB — NUSWAB VAGINITIS PLUS (VG+)
Atopobium vaginae: HIGH Score — AB
BVAB 2: HIGH Score — AB
Candida albicans, NAA: NEGATIVE
Candida glabrata, NAA: NEGATIVE
Chlamydia trachomatis, NAA: NEGATIVE
Megasphaera 1: HIGH Score — AB
Neisseria gonorrhoeae, NAA: NEGATIVE
Trich vag by NAA: NEGATIVE

## 2022-03-18 MED ORDER — METRONIDAZOLE 0.75 % VA GEL: 1.0000 | Freq: Every evening | VAGINAL | 0 refills | Status: DC

## 2022-03-25 ENCOUNTER — Ambulatory Visit: Payer: Commercial Managed Care - PPO | Admitting: Family Medicine

## 2022-03-30 ENCOUNTER — Encounter: Payer: Self-pay | Admitting: Obstetrics

## 2022-03-30 ENCOUNTER — Other Ambulatory Visit (HOSPITAL_COMMUNITY)
Admission: RE | Admit: 2022-03-30 | Discharge: 2022-03-30 | Disposition: A | Discharge status: Home / Self Care | Payer: Commercial Managed Care - PPO | Source: Ambulatory Visit | Attending: Family Medicine | Admitting: Family Medicine

## 2022-03-30 ENCOUNTER — Ambulatory Visit (INDEPENDENT_AMBULATORY_CARE_PROVIDER_SITE_OTHER): Payer: Commercial Managed Care - PPO | Admitting: Obstetrics

## 2022-03-30 VITALS — BP 110/70 | Ht 60.0 in | Wt 178.0 lb

## 2022-03-30 DIAGNOSIS — Z1151 Encounter for screening for human papillomavirus (HPV): Secondary | ICD-10-CM | POA: Diagnosis present

## 2022-03-30 DIAGNOSIS — Z124 Encounter for screening for malignant neoplasm of cervix: Secondary | ICD-10-CM | POA: Diagnosis present

## 2022-03-30 DIAGNOSIS — Z01419 Encounter for gynecological examination (general) (routine) without abnormal findings: Secondary | ICD-10-CM | POA: Diagnosis not present

## 2022-03-30 DIAGNOSIS — Z1331 Encounter for screening for depression: Secondary | ICD-10-CM

## 2022-03-30 DIAGNOSIS — Z1211 Encounter for screening for malignant neoplasm of colon: Secondary | ICD-10-CM

## 2022-03-30 DIAGNOSIS — R87810 Cervical high risk human papillomavirus (HPV) DNA test positive: Secondary | ICD-10-CM | POA: Diagnosis not present

## 2022-03-30 LAB — POCT URINALYSIS DIPSTICK
Bilirubin, UA: NEGATIVE
Blood, UA: NEGATIVE
Glucose, UA: NEGATIVE
Ketones, UA: NEGATIVE
Leukocytes, UA: NEGATIVE
Nitrite, UA: NEGATIVE
Protein, UA: NEGATIVE
Spec Grav, UA: 1.02 (ref 1.010–1.025)
Urobilinogen, UA: 0.2 E.U./dL
pH, UA: 7 (ref 5.0–8.0)

## 2022-03-30 NOTE — Patient Instructions (Signed)
Have a great year! Please call with any concerns. Don't forget to wear your seatbelt everyday! If you are not signed up on MyChart, please ask [place] how to sign up for it!   In a world where you can be anything, please be kind.   There is no height or weight on file to calculate BMI.  A Healthy Lifestyle: Care Instructions Your Care Instructions  A healthy lifestyle can help you feel good, stay at a healthy weight, and have plenty of energy for both work and play. A healthy lifestyle is something you can share with your whole family. A healthy lifestyle also can lower your risk for serious health problems, such as high blood pressure, heart disease, and diabetes. You can follow a few steps listed below to improve your health and the health of your family. Follow-up care is a key part of your treatment and safety. Be sure to make and go to all appointments, and call your doctor if you are having problems. It's also a good idea to know your test results and keep a list of the medicines you take. How can you care for yourself at home? Do not eat too much sugar, fat, or fast foods. You can still have dessert and treats now and then. The goal is moderation. Start small to improve your eating habits. Pay attention to portion sizes, drink less juice and soda pop, and eat more fruits and vegetables. Eat a healthy amount of food. A 3-ounce serving of meat, for example, is about the size of a deck of cards. Fill the rest of your plate with vegetables and whole grains. Limit the amount of soda and sports drinks you have every day. Drink more water when you are thirsty. Eat at least 5 servings of fruits and vegetables every day. It may seem like a lot, but it is not hard to reach this goal. A serving or helping is 1 piece of fruit, 1 cup of vegetables, or 2 cups of leafy, raw vegetables. Have an apple or some carrot sticks as an afternoon snack instead of a candy bar. Try to have fruits and/or vegetables at  every meal. Make exercise part of your daily routine. You may want to start with simple activities, such as walking, bicycling, or slow swimming. Try to be active 30 to 60 minutes every day. You do not need to do all 30 to 60 minutes all at once. For example, you can exercise 3 times a day for 10 or 20 minutes. Moderate exercise is safe for most people, but it is always a good idea to talk to your doctor before starting an exercise program. Keep moving. Mow the lawn, work in the garden, or [employer]. Take the stairs instead of the elevator at work. If you smoke, quit. People who smoke have an increased risk for heart attack, stroke, cancer, and other lung illnesses. Quitting is hard, but there are ways to boost your chance of quitting tobacco for good. Use nicotine gum, patches, or lozenges. Ask your doctor about stop-smoking programs and medicines. Keep trying. In addition to reducing your risk of diseases in the future, you will notice some benefits soon after you stop using tobacco. If you have shortness of breath or asthma symptoms, they will likely get better within a few weeks after you quit. Limit how much alcohol you drink. Moderate amounts of alcohol (up to 2 drinks a day for men, 1 drink a day for women) are okay. But drinking  too much can lead to liver problems, high blood pressure, and other health problems. Family health If you have a family, there are many things you can do together to improve your health. Eat meals together as a family as often as possible. Eat healthy foods. This includes fruits, vegetables, lean meats and dairy, and whole grains. Include your family in your fitness plan. Most people think of activities such as jogging or tennis as the way to fitness, but there are many ways you and your family can be more active. Anything that makes you breathe hard and gets your heart pumping is exercise. Here are some tips: Walk to do errands or to take your child to school or  the bus. Go for a family bike ride after dinner instead of watching TV. Care instructions adapted under license by your healthcare professional. This care instruction is for use with your licensed healthcare professional. If you have questions about a medical condition or this instruction, always ask your healthcare professional. [place] any warranty or liability for your use of this information.
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
## 2022-03-30 NOTE — Addendum Note (Signed)
Addended by: Brien Few on: 03/30/2022 08:52 AM   Modules accepted: Orders

## 2022-04-01 LAB — CYTOLOGY - PAP
Comment: NEGATIVE
Diagnosis: UNDETERMINED — AB
High risk HPV: NEGATIVE

## 2022-04-04 NOTE — Telephone Encounter (Signed)
-----   Message from April Manson, MD sent at 04/02/2022  8:34 AM EDT ----- Please inform Lurene Shadow that pap shows atypical cells, but high risk human papilloma virus (HRHPV) test is now negative. Based on two prior results of normal pap and positive HPV, recommendation is cotest in 1 year.

## 2022-04-04 NOTE — Telephone Encounter (Signed)
This encounter was created in error - please disregard.

## 2022-04-04 NOTE — Telephone Encounter (Signed)
Patient aware.

## 2022-07-05 ENCOUNTER — Ambulatory Visit (INDEPENDENT_AMBULATORY_CARE_PROVIDER_SITE_OTHER): Payer: Commercial Managed Care - PPO | Admitting: Obstetrics and Gynecology

## 2022-07-05 ENCOUNTER — Encounter: Payer: Self-pay | Admitting: Obstetrics and Gynecology

## 2022-07-05 VITALS — BP 100/60 | Ht 60.0 in | Wt 181.0 lb

## 2022-07-05 DIAGNOSIS — N898 Other specified noninflammatory disorders of vagina: Secondary | ICD-10-CM | POA: Diagnosis not present

## 2022-07-05 DIAGNOSIS — Z113 Encounter for screening for infections with a predominantly sexual mode of transmission: Secondary | ICD-10-CM | POA: Diagnosis not present

## 2022-07-05 LAB — POCT WET PREP WITH KOH
Clue Cells Wet Prep HPF POC: NEGATIVE
KOH Prep POC: NEGATIVE
Trichomonas, UA: NEGATIVE
Yeast Wet Prep HPF POC: NEGATIVE

## 2022-07-05 NOTE — Progress Notes (Signed)
Latoya Hector, MD   Chief Complaint  Patient presents with   Vaginal Itching    Irritation, no discharge or odor x 10 days    HPI:      [name] is a 46 y.o. [id number] whose LMP was No LMP recorded. Patient has had an ablation., presents today for vaginal irritation and itching for 10 days, no increased vag d/c/odor. Treated with monistat-3 with some itch improvement but still there. Hx of BV on nuswab 5/23.  No urin sx. Is using zest soap and recently started using dryer sheets. No recent abx use. Has a new sexual partner, using condoms. Has some dyspareunia since itching started. Would like full STD testing. Hx of ext genital wart and HPV on pap in past.  ASCUS/neg HPV DNA pap 6/23 Hx of RT bartholins cyst in past, comes and goes, present today.  Patient Active Problem List   Diagnosis Date Noted   Labial abscess 08/25/2021   BV (bacterial vaginosis) 01/18/2017   Menometrorrhagia 06/16/2016    Past Surgical History:  Procedure Laterality Date   ABLATION     CHOLECYSTECTOMY  2010   DILATION AND CURETTAGE OF UTERUS     HYSTEROSCOPY WITH D & C N/A 06/16/2016   Procedure: DILATATION AND CURETTAGE /HYSTEROSCOPY;  Surgeon: Gae Dry, MD;  Location: [facility];  Service: Gynecology;  Laterality: N/A;   IUD REMOVAL N/A 06/16/2016   Procedure: INTRAUTERINE DEVICE (IUD) REMOVAL;  Surgeon: Gae Dry, MD;  Location: [facility];  Service: Gynecology;  Laterality: N/A;   LAPAROSCOPIC OVARIAN CYSTECTOMY Left 06/16/2016   Procedure: LAPAROSCOPIC OVARIAN CYSTECTOMY;  Surgeon: Gae Dry, MD;  Location: [facility];  Service: Gynecology;  Laterality: Left;   LAPAROSCOPIC TUBAL LIGATION Bilateral 06/16/2016   Procedure: LAPAROSCOPIC TUBAL LIGATION;  Surgeon: Gae Dry, MD;  Location: [facility];  Service: Gynecology;  Laterality: Bilateral;   TUBAL LIGATION      Family History  Problem Relation Age of Onset   Diabetes Mother    Hypertension Mother    Diabetes  Father    Heart disease Father    Alcohol abuse Father     Social History   Socioeconomic History   Marital status: Soil scientist    Spouse name: Not on file   Number of children: Not on file   Years of education: Not on file   Highest education level: Not on file  Occupational History   Not on file  Tobacco Use   Smoking status: Never   Smokeless tobacco: Never  Vaping Use   Vaping Use: Never used  Substance and Sexual Activity   Alcohol use: Yes    Comment: occasionally   Drug use: No   Sexual activity: Yes    Birth control/protection: Surgical    Comment: tubal  Other Topics Concern   Not on file  Social History Narrative   Not on file   Social Determinants of Health   Financial Resource Strain: Not on file  Food Insecurity: Not on file  Transportation Needs: Not on file  Physical Activity: Not on file  Stress: Not on file  Social Connections: Not on file  Intimate Partner Violence: Not on file    Outpatient Medications Prior to Visit  Medication Sig Dispense Refill   busPIRone (BUSPAR) 7.5 MG tablet Take 7.5 mg by mouth 2 (two) times daily.     escitalopram (LEXAPRO) 20 MG tablet Take 20 mg by mouth daily.  traZODone (DESYREL) 50 MG tablet Take 50 mg by mouth at bedtime.     cetirizine (ZYRTEC) 10 MG tablet Take by mouth.     levothyroxine (SYNTHROID) 100 MCG tablet Take 100 mcg by mouth daily.     Semaglutide (OZEMPIC, 0.25 OR 0.5 MG/DOSE, [place]) Inject into the skin.     No facility-administered medications prior to visit.      ROS:  Review of Systems  Constitutional:  Negative for fever.  Gastrointestinal:  Negative for blood in stool, constipation, diarrhea, nausea and vomiting.  Genitourinary:  Negative for dyspareunia, dysuria, flank pain, frequency, hematuria, urgency, vaginal bleeding, vaginal discharge and vaginal pain.  Musculoskeletal:  Negative for back pain.  Skin:  Negative for rash.   BREAST: No symptoms   OBJECTIVE:    Vitals:  BP 100/60   Ht 5' (1.524 m)   Wt 181 lb (82.1 kg)   BMI 35.35 kg/m   Physical Exam Vitals reviewed.  Constitutional:      Appearance: She is well-developed.  Pulmonary:     Effort: Pulmonary effort is normal.  Genitourinary:    General: Normal vulva.     Pubic Area: No rash.      Labia:        Right: Lesion present. No rash or tenderness.        Left: Lesion present. No rash or tenderness.      Vagina: Vaginal discharge present. No erythema or tenderness.     Cervix: Normal.     Uterus: Normal. Not enlarged and not tender.      Adnexa: Right adnexa normal and left adnexa normal.       Right: No mass or tenderness.         Left: No mass or tenderness.      Musculoskeletal:        General: Normal range of motion.     Cervical back: Normal range of motion.  Skin:    General: Skin is warm and dry.  Neurological:     General: No focal deficit present.     Mental Status: She is alert and oriented to person, place, and time.  Psychiatric:        Mood and Affect: Mood normal.        Behavior: Behavior normal.        Thought Content: Thought content normal.        Judgment: Judgment normal.     Results: Results for orders placed or performed in visit on 07/05/22 (from the past 24 hour(s))  POCT Wet Prep with KOH     Status: Normal   Collection Time: 07/05/22 11:56 AM  Result Value Ref Range   Trichomonas, UA Negative    Clue Cells Wet Prep HPF POC neg    Epithelial Wet Prep HPF POC     Yeast Wet Prep HPF POC neg    Bacteria Wet Prep HPF POC     RBC Wet Prep HPF POC     WBC Wet Prep HPF POC     KOH Prep POC Negative Negative     Assessment/Plan: Vaginal irritation - Plan: NuSwab Vaginitis Plus (VG+), POCT Wet Prep with KOH; neg exam, neg wet prep. Check culture. Will f/u if positive. If neg, sx most likely chem derm. Dove sens skin soap, line dry underwear/OTC hydrocortisone crm ext prn.   Screening for STD (sexually transmitted disease) - Plan: HIV  Antibody (routine testing w rflx), RPR, Hepatitis C antibody, NuSwab Vaginitis Plus (VG+)  Return if symptoms worsen or fail to improve.  Ceanna Wareing B. Ski Polich, PA-C 07/05/2022 11:57 AM

## 2022-07-05 NOTE — Patient Instructions (Signed)
I value your feedback and you entrusting us with your care. If you get a [facility] patient survey, I would appreciate you taking the time to let us know about your experience today. Thank you! ? ? ?

## 2022-07-06 LAB — RPR: RPR Ser Ql: NONREACTIVE

## 2022-07-06 LAB — HIV ANTIBODY (ROUTINE TESTING W REFLEX): HIV Screen 4th Generation wRfx: NONREACTIVE

## 2022-07-06 LAB — HEPATITIS C ANTIBODY: Hep C Virus Ab: NONREACTIVE

## 2022-07-08 LAB — NUSWAB VAGINITIS PLUS (VG+)
Candida albicans, NAA: NEGATIVE
Candida glabrata, NAA: NEGATIVE
Chlamydia trachomatis, NAA: NEGATIVE
Neisseria gonorrhoeae, NAA: NEGATIVE
Trich vag by NAA: NEGATIVE

## 2022-11-24 ENCOUNTER — Other Ambulatory Visit: Payer: Self-pay | Admitting: Obstetrics and Gynecology

## 2022-11-24 ENCOUNTER — Encounter: Payer: Self-pay | Admitting: Obstetrics and Gynecology

## 2022-11-24 MED ORDER — METRONIDAZOLE 0.75 % VA GEL: 1.0000 | Freq: Every evening | VAGINAL | 0 refills | Status: AC

## 2022-11-24 NOTE — Progress Notes (Signed)
Rx metrogel for BV sx

## 2023-05-20 LAB — COLOGUARD: COLOGUARD: NEGATIVE

## 2023-07-12 ENCOUNTER — Ambulatory Visit: Payer: BC Managed Care – PPO

## 2023-07-12 DIAGNOSIS — Z23 Encounter for immunization: Secondary | ICD-10-CM | POA: Diagnosis not present

## 2023-07-12 DIAGNOSIS — Z719 Counseling, unspecified: Secondary | ICD-10-CM

## 2023-07-12 NOTE — Progress Notes (Signed)
Pt in clinic for Covid and Flu vaccines with son. Eligible, given VIS, agreed for 2 vaccines. Administered Fluzone, and [employer], yr 2024-2025. Monitored for 5 min (per pt request), tolerated well. Given [facility] copies, explained and understood. M.Najiyah Paris, LPN.

## 2024-08-09 ENCOUNTER — Ambulatory Visit
